# Patient Record
Sex: Female | Born: 1990 | Race: Black or African American | Hispanic: No | Marital: Single | Smoking: Never smoker
Health system: Southern US, Community
[De-identification: ages and names within clinical notes are randomized; demographics above are authoritative.]

## PROBLEM LIST (undated history)

## (undated) DIAGNOSIS — R519 Headache, unspecified: Secondary | ICD-10-CM

## (undated) HISTORY — PX: NO PAST SURGERIES: SHX2092

---

## 2012-04-28 ENCOUNTER — Encounter (HOSPITAL_COMMUNITY): Payer: Self-pay | Admitting: *Deleted

## 2012-04-28 ENCOUNTER — Emergency Department (INDEPENDENT_AMBULATORY_CARE_PROVIDER_SITE_OTHER)
Admission: EM | Admit: 2012-04-28 | Discharge: 2012-04-28 | Disposition: A | Discharge status: Home / Self Care | Payer: Medicaid Other | Source: Home / Self Care | Attending: Family Medicine | Admitting: Family Medicine

## 2012-04-28 DIAGNOSIS — K029 Dental caries, unspecified: Secondary | ICD-10-CM

## 2012-04-28 MED ORDER — CLINDAMYCIN HCL 150 MG PO CAPS: 150.0000 mg | ORAL_CAPSULE | Freq: Four times a day (QID) | ORAL | Status: DC

## 2012-04-28 MED ORDER — DICLOFENAC POTASSIUM 50 MG PO TABS: 50.0000 mg | ORAL_TABLET | Freq: Three times a day (TID) | ORAL | Status: DC

## 2012-04-28 NOTE — ED Provider Notes (Signed)
History     CSN: [id number]  Arrival date & time 04/28/12  1155   First MD Initiated Contact with Patient 04/28/12 1201      Chief Complaint  Patient presents with  . Dental Pain    (Consider location/radiation/quality/duration/timing/severity/associated sxs/prior treatment) Patient is a 21 y.o. female presenting with tooth pain. The history is provided by the patient.  Dental PainThe primary symptoms include mouth pain. Primary symptoms do not include fever. The symptoms began more than 1 week ago (filling fell out of tooth and pain has progressed since.). The symptoms are worsening. The symptoms are new. The symptoms occur constantly.  Additional symptoms include: dental sensitivity to temperature and jaw pain. Additional symptoms do not include: facial swelling, trouble swallowing and pain with swallowing.    History reviewed. No pertinent past medical history.  History reviewed. No pertinent past surgical history.  Family History  Problem Relation Age of Onset  . Family history unknown: Yes    History  Substance Use Topics  . Smoking status: Never Smoker   . Smokeless tobacco: Not on file  . Alcohol Use: No    OB History    Grav Para Term Preterm Abortions TAB SAB Ect Mult Living                  Review of Systems  Constitutional: Negative.  Negative for fever.  HENT: Positive for dental problem. Negative for facial swelling, trouble swallowing and neck pain.     Allergies  Sulfa antibiotics  Home Medications   Current Outpatient Rx  Name  Route  Sig  Dispense  Refill  . CLINDAMYCIN HCL 150 MG PO CAPS   Oral   Take 1 capsule (150 mg total) by mouth 4 (four) times daily.   28 capsule   0   . DICLOFENAC POTASSIUM 50 MG PO TABS   Oral   Take 1 tablet (50 mg total) by mouth 3 (three) times daily.   21 tablet   0     BP 122/74  Pulse 72  Temp 98.2 F (36.8 C) (Oral)  Resp 14  SpO2 98%  Physical Exam  Nursing note and vitals  reviewed. Constitutional: She appears well-developed and well-nourished.  HENT:  Head: Normocephalic.  Right Ear: External ear normal.  Left Ear: External ear normal.  Mouth/Throat: Uvula is midline and oropharynx is clear and moist.      ED Course  Procedures (including critical care time)  Labs Reviewed - No data to display No results found.   1. Pain due to dental caries       MDM          Linna Hoff, MD 04/28/12 1301

## 2012-04-28 NOTE — ED Notes (Signed)
Pt reports dental pain on bottom left side of mouth - states that filling recently fell out

## 2013-02-23 ENCOUNTER — Encounter (HOSPITAL_COMMUNITY): Payer: Self-pay | Admitting: Emergency Medicine

## 2013-02-23 ENCOUNTER — Emergency Department (HOSPITAL_COMMUNITY)
Admission: EM | Admit: 2013-02-23 | Discharge: 2013-02-23 | Disposition: A | Discharge status: Home / Self Care | Payer: Medicaid Other | Attending: Emergency Medicine | Admitting: Emergency Medicine

## 2013-02-23 DIAGNOSIS — A6009 Herpesviral infection of other urogenital tract: Secondary | ICD-10-CM

## 2013-02-23 DIAGNOSIS — Z79899 Other long term (current) drug therapy: Secondary | ICD-10-CM | POA: Insufficient documentation

## 2013-02-23 DIAGNOSIS — Z792 Long term (current) use of antibiotics: Secondary | ICD-10-CM | POA: Insufficient documentation

## 2013-02-23 DIAGNOSIS — A6000 Herpesviral infection of urogenital system, unspecified: Secondary | ICD-10-CM | POA: Insufficient documentation

## 2013-02-23 DIAGNOSIS — Z3202 Encounter for pregnancy test, result negative: Secondary | ICD-10-CM | POA: Insufficient documentation

## 2013-02-23 DIAGNOSIS — Z791 Long term (current) use of non-steroidal anti-inflammatories (NSAID): Secondary | ICD-10-CM | POA: Insufficient documentation

## 2013-02-23 DIAGNOSIS — R300 Dysuria: Secondary | ICD-10-CM | POA: Insufficient documentation

## 2013-02-23 LAB — URINALYSIS, ROUTINE W REFLEX MICROSCOPIC
Bilirubin Urine: NEGATIVE
Glucose, UA: NEGATIVE mg/dL
Hgb urine dipstick: NEGATIVE
Ketones, ur: NEGATIVE mg/dL
Leukocytes, UA: NEGATIVE
Nitrite: NEGATIVE
Protein, ur: NEGATIVE mg/dL
Specific Gravity, Urine: 1.03 (ref 1.005–1.030)
Urobilinogen, UA: 0.2 mg/dL (ref 0.0–1.0)
pH: 6 (ref 5.0–8.0)

## 2013-02-23 LAB — WET PREP, GENITAL
Clue Cells Wet Prep HPF POC: NONE SEEN
Trich, Wet Prep: NONE SEEN
Yeast Wet Prep HPF POC: NONE SEEN

## 2013-02-23 LAB — POCT PREGNANCY, URINE: Preg Test, Ur: NEGATIVE

## 2013-02-23 MED ORDER — ACYCLOVIR 400 MG PO TABS: 400.0000 mg | ORAL_TABLET | Freq: Three times a day (TID) | ORAL | Status: AC

## 2013-02-23 MED ORDER — HYDROCODONE-ACETAMINOPHEN 5-325 MG PO TABS: 1.0000 | ORAL_TABLET | Freq: Four times a day (QID) | ORAL | Status: DC | PRN

## 2013-02-23 MED ORDER — OXYCODONE-ACETAMINOPHEN 5-325 MG PO TABS
2.0000 | ORAL_TABLET | Freq: Once | ORAL | Status: AC
  Administered 2013-02-23: 2 via ORAL
  Filled 2013-02-23: qty 2

## 2013-02-23 NOTE — ED Provider Notes (Signed)
CSN: [id number]     Arrival date & time 02/23/13  0225 History   First MD Initiated Contact with Patient 02/23/13 [phone]     Chief Complaint  Patient presents with  . Vaginal Pain  . Abscess   (Consider location/radiation/quality/duration/timing/severity/associated sxs/prior Treatment) HPI Comments: C/o vaginal pain r/t vaginal boil (reports 2), onset 2d ago, also reports drainage for 1 day .Denies: UTI sx, back pain, hematuria, nvd, fever.   Patient is a 22 y.o. female presenting with female genitourinary complaint. The history is provided by the patient. No language interpreter was used.  Female GU Problem This is a new problem. The current episode started 2 days ago. The problem occurs constantly. The problem has been gradually improving. Pertinent negatives include no chest pain, no abdominal pain, no headaches and no shortness of breath. Exacerbated by: urination. Nothing relieves the symptoms. She has tried nothing for the symptoms. The treatment provided no relief.    History reviewed. No pertinent past medical history. History reviewed. No pertinent past surgical history. History reviewed. No pertinent family history. History  Substance Use Topics  . Smoking status: Never Smoker   . Smokeless tobacco: Not on file  . Alcohol Use: No   OB History   Grav Para Term Preterm Abortions TAB SAB Ect Mult Living                 Review of Systems  Constitutional: Negative for fever, chills, diaphoresis, activity change, appetite change and fatigue.  HENT: Negative for congestion, sore throat, facial swelling, rhinorrhea, neck pain and neck stiffness.   Eyes: Negative for photophobia and discharge.  Respiratory: Negative for cough, chest tightness and shortness of breath.   Cardiovascular: Negative for chest pain, palpitations and leg swelling.  Gastrointestinal: Negative for nausea, vomiting, abdominal pain and diarrhea.  Endocrine: Negative for polydipsia and polyuria.   Genitourinary: Positive for dysuria, genital sores and vaginal pain. Negative for frequency, difficulty urinating and pelvic pain.  Musculoskeletal: Negative for back pain and arthralgias.  Skin: Negative for color change and wound.  Allergic/Immunologic: Negative for immunocompromised state.  Neurological: Negative for facial asymmetry, weakness, numbness and headaches.  Hematological: Does not bruise/bleed easily.  Psychiatric/Behavioral: Negative for confusion and agitation.    Allergies  Sulfa antibiotics  Home Medications   Current Outpatient Rx  Name  Route  Sig  Dispense  Refill  . acyclovir (ZOVIRAX) 400 MG tablet   Oral   Take 1 tablet (400 mg total) by mouth 3 (three) times daily.   50 tablet   0   . clindamycin (CLEOCIN) 150 MG capsule   Oral   Take 1 capsule (150 mg total) by mouth 4 (four) times daily.   28 capsule   0   . diclofenac (CATAFLAM) 50 MG tablet   Oral   Take 1 tablet (50 mg total) by mouth 3 (three) times daily.   21 tablet   0   . HYDROcodone-acetaminophen (NORCO/VICODIN) 5-325 MG per tablet   Oral   Take 1 tablet by mouth every 6 (six) hours as needed for pain.   15 tablet   0    BP 101/66  Pulse 68  Temp(Src) 98.3 F (36.8 C) (Oral)  Resp 18  Wt 105 lb (47.628 kg)  SpO2 98% Physical Exam  Constitutional: She is oriented to person, place, and time. She appears well-developed and well-nourished. No distress.  HENT:  Head: Normocephalic and atraumatic.  Mouth/Throat: No oropharyngeal exudate.  Eyes: Pupils are equal, round, and  reactive to light.  Neck: Normal range of motion. Neck supple.  Cardiovascular: Normal rate, regular rhythm and normal heart sounds.  Exam reveals no gallop and no friction rub.   No murmur heard. Pulmonary/Chest: Effort normal and breath sounds normal. No respiratory distress. She has no wheezes. She has no rales.  Abdominal: Soft. Bowel sounds are normal. She exhibits no distension and no mass. There is no  tenderness. There is no rebound and no guarding.  Genitourinary: There is tenderness and lesion on the right labia. There is tenderness and lesion on the left labia.  Multiple lesions of vulva & labia. Small raised lesions with straw colored vesicle overlying erythematous base, some are shallow ulcerations.  Same seen distal vagina, one seen on cervix.   Musculoskeletal: Normal range of motion. She exhibits no edema and no tenderness.  Neurological: She is alert and oriented to person, place, and time.  Skin: Skin is warm and dry.  Psychiatric: She has a normal mood and affect.    ED Course  Procedures (including critical care time) Labs Review Labs Reviewed  WET PREP, GENITAL - Abnormal; Notable for the following:    WBC, Wet Prep HPF POC MANY (*)    All other components within normal limits  GC/CHLAMYDIA PROBE AMP  HERPES SIMPLEX VIRUS CULTURE  URINALYSIS, ROUTINE W REFLEX MICROSCOPIC  POCT PREGNANCY, URINE   Imaging Review No results found.  MDM   Pt is a 22 y.o. female with Pmhx as above who presents with 2 days of painful vagnal lesion that on exam are c/w genital herpes of vulva, labia, and several intravagnal lesion.  Pt able to urinate with some pain.  Urine not infection.  Wet prep unremarkable.  GC/Chlam pendin as well as HSV viral culture.  Will start on 10d course of acyclovir, as her to fu with gyn. Return precautions given for new or worsening symptoms   1. 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 Genital herpes in women     Shanna Cisco, MD 02/23/13 Ernestina [place]

## 2013-02-23 NOTE — ED Notes (Signed)
Docherty, MD at bedside.  

## 2013-02-23 NOTE — ED Notes (Signed)
C/o vaginal pain r/t vaginal boil (reports 2), onset 3d ago, also reports drainage, (denies: UTI sx, back pain, hematuria, nvd, fever or other sx), took acetaminophen at 2100 (not sure of dosage). H/o similar boils in the past, "not as bad".

## 2013-02-24 LAB — GC/CHLAMYDIA PROBE AMP
CT Probe RNA: NEGATIVE
GC Probe RNA: NEGATIVE

## 2013-02-26 LAB — HERPES SIMPLEX VIRUS CULTURE
Culture: DETECTED
Special Requests: NORMAL

## 2013-12-29 ENCOUNTER — Encounter (HOSPITAL_COMMUNITY): Payer: Self-pay | Admitting: Emergency Medicine

## 2013-12-29 ENCOUNTER — Emergency Department (HOSPITAL_COMMUNITY)
Admission: EM | Admit: 2013-12-29 | Discharge: 2013-12-29 | Disposition: A | Discharge status: Home / Self Care | Payer: BC Managed Care – PPO | Source: Home / Self Care | Attending: Family Medicine | Admitting: Family Medicine

## 2013-12-29 DIAGNOSIS — R0602 Shortness of breath: Secondary | ICD-10-CM

## 2013-12-29 NOTE — ED Provider Notes (Signed)
CSN: [id number]     Arrival date & time 12/29/13  1836 History   First MD Initiated Contact with Patient 12/29/13 1930     Chief Complaint  Patient presents with  . Shortness of Breath   (Consider location/radiation/quality/duration/timing/severity/associated sxs/prior Treatment) Patient is a 23 y.o. female presenting with shortness of breath. The history is provided by the patient.  Shortness of Breath Severity:  Mild Onset quality:  Sudden Duration:  1 hour Timing:  Sporadic Progression:  Improving Chronicity:  Chronic (sx for over 2526yr) Relieved by:  None tried Worsened by:  Nothing tried Ineffective treatments:  Inhaler Associated symptoms: chest pain   Associated symptoms: no abdominal pain, no cough, no fever and no PND     History reviewed. No pertinent past medical history. History reviewed. No pertinent past surgical history. History reviewed. No pertinent family history. History  Substance Use Topics  . Smoking status: Never Smoker   . Smokeless tobacco: Not on file  . Alcohol Use: No   OB History   Grav Para Term Preterm Abortions TAB SAB Ect Mult Living                 Review of Systems  Constitutional: Negative.  Negative for fever.  HENT: Negative.   Respiratory: Positive for shortness of breath. Negative for cough and chest tightness.   Cardiovascular: Positive for chest pain. Negative for palpitations, leg swelling and PND.  Gastrointestinal: Negative.  Negative for abdominal pain.    Allergies  Sulfa antibiotics  Home Medications   Prior to Admission medications   Medication Sig Start Date End Date Taking? Authorizing Provider  clindamycin (CLEOCIN) 150 MG capsule Take 1 capsule (150 mg total) by mouth 4 (four) times daily. 04/28/12   Linna HoffJames D Avantae Bither, MD  diclofenac (CATAFLAM) 50 MG tablet Take 1 tablet (50 mg total) by mouth 3 (three) times daily. 04/28/12   Linna HoffJames D Lainie Daubert, MD  HYDROcodone-acetaminophen (NORCO/VICODIN) 5-325 MG per tablet Take 1  tablet by mouth every 6 (six) hours as needed for pain. 02/23/13   Shanna CiscoMegan E Docherty, MD   BP 109/82  Pulse 79  Temp(Src) 98.4 F (36.9 C) (Oral)  Resp 18  SpO2 100% Physical Exam  Nursing note and vitals reviewed. Constitutional: She is oriented to person, place, and time. She appears well-developed and well-nourished. No distress.  HENT:  Mouth/Throat: Oropharynx is clear and moist.  Neck: Normal range of motion. Neck supple.  Cardiovascular: Normal heart sounds.   Pulmonary/Chest: Effort normal and breath sounds normal. No respiratory distress. She has no wheezes. She has no rales. She exhibits tenderness.  Lymphadenopathy:    She has no cervical adenopathy.  Neurological: She is alert and oriented to person, place, and time.  Skin: Skin is warm and dry.    ED Course  Procedures (including critical care time) Labs Review Labs Reviewed - No data to display  Imaging Review No results found.   MDM   1. Exertional shortness of breath        Linna HoffJames D Ainara Eldridge, MD 12/29/13 2004

## 2013-12-29 NOTE — ED Notes (Signed)
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 Hurts to breathe

## 2013-12-29 NOTE — Discharge Instructions (Signed)
Call to see lung specialist for further eval as needed.

## 2014-06-19 ENCOUNTER — Encounter (HOSPITAL_COMMUNITY): Payer: Self-pay | Admitting: Physical Medicine and Rehabilitation

## 2014-06-19 ENCOUNTER — Emergency Department (HOSPITAL_COMMUNITY)
Admission: EM | Admit: 2014-06-19 | Discharge: 2014-06-19 | Disposition: A | Discharge status: Home / Self Care | Payer: BLUE CROSS/BLUE SHIELD | Attending: Emergency Medicine | Admitting: Emergency Medicine

## 2014-06-19 DIAGNOSIS — R05 Cough: Secondary | ICD-10-CM | POA: Diagnosis present

## 2014-06-19 DIAGNOSIS — R519 Headache, unspecified: Secondary | ICD-10-CM

## 2014-06-19 DIAGNOSIS — R51 Headache: Secondary | ICD-10-CM | POA: Insufficient documentation

## 2014-06-19 DIAGNOSIS — Z791 Long term (current) use of non-steroidal anti-inflammatories (NSAID): Secondary | ICD-10-CM | POA: Insufficient documentation

## 2014-06-19 DIAGNOSIS — Z720 Tobacco use: Secondary | ICD-10-CM | POA: Insufficient documentation

## 2014-06-19 DIAGNOSIS — Z792 Long term (current) use of antibiotics: Secondary | ICD-10-CM | POA: Insufficient documentation

## 2014-06-19 MED ORDER — ALBUTEROL SULFATE HFA 108 (90 BASE) MCG/ACT IN AERS: 1.0000 | INHALATION_SPRAY | Freq: Four times a day (QID) | RESPIRATORY_TRACT | Status: DC | PRN

## 2014-06-19 MED ORDER — KETOROLAC TROMETHAMINE 30 MG/ML IJ SOLN
30.0000 mg | Freq: Once | INTRAMUSCULAR | Status: AC
  Administered 2014-06-19: 30 mg via INTRAVENOUS
  Filled 2014-06-19: qty 1

## 2014-06-19 MED ORDER — DEXAMETHASONE SODIUM PHOSPHATE 10 MG/ML IJ SOLN: 10.0000 mg | Freq: Once | INTRAMUSCULAR | Status: DC

## 2014-06-19 MED ORDER — DEXAMETHASONE SODIUM PHOSPHATE 10 MG/ML IJ SOLN
10.0000 mg | Freq: Once | INTRAMUSCULAR | Status: AC
  Administered 2014-06-19: 10 mg via INTRAVENOUS
  Filled 2014-06-19: qty 1

## 2014-06-19 NOTE — ED Notes (Addendum)
States has shortness of breath and tightness when breathing-- states has been going to the dr for a year and a half for same -- inhaler is expired.   Also c/o headache-- migraine-- hx of same

## 2014-06-19 NOTE — Discharge Instructions (Signed)
You are having a headache. No specific cause was found today for your headache. It may have been a migraine or other cause of headache. Stress, anxiety, fatigue, and depression are common triggers for headaches. Your headache today does not appear to be life-threatening or require hospitalization, but often the exact cause of headaches is not determined in the emergency department. Therefore, follow-up with your doctor is very important to find out what may have caused your headache, and whether or not you need any further diagnostic testing or treatment. Sometimes headaches can appear benign (not harmful), but then more serious symptoms can develop which should prompt an immediate re-evaluation by your doctor or the emergency department. SEEK MEDICAL ATTENTION IF: You develop possible problems with medications prescribed.  The medications don't resolve your headache, if it recurs , or if you have multiple episodes of vomiting or can't take fluids. You have a change from the usual headache. RETURN IMMEDIATELY IF you develop a sudden, severe headache or confusion, become poorly responsive or faint, develop a fever above 100.38F or problem breathing, have a change in speech, vision, swallowing, or understanding, or develop new weakness, numbness, tingling, incoordination, or have a seizure.  Migraine Headache A migraine headache is an intense, throbbing pain on one or both sides of your head. A migraine can last for 30 minutes to several hours. CAUSES  The exact cause of a migraine headache is not always known. However, a migraine may be caused when nerves in the brain become irritated and release chemicals that cause inflammation. This causes pain. Certain things may also trigger migraines, such as:  Alcohol.  Smoking.  Stress.  Menstruation.  Aged cheeses.  Foods or drinks that contain nitrates, glutamate, aspartame, or tyramine.  Lack of  sleep.  Chocolate.  Caffeine.  Hunger.  Physical exertion.  Fatigue.  Medicines used to treat chest pain (nitroglycerine), birth control pills, estrogen, and some blood pressure medicines. SIGNS AND SYMPTOMS  Pain on one or both sides of your head.  Pulsating or throbbing pain.  Severe pain that prevents daily activities.  Pain that is aggravated by any physical activity.  Nausea, vomiting, or both.  Dizziness.  Pain with exposure to bright lights, loud noises, or activity.  General sensitivity to bright lights, loud noises, or smells. Before you get a migraine, you may get warning signs that a migraine is coming (aura). An aura may include:  Seeing flashing lights.  Seeing bright spots, halos, or zigzag lines.  Having tunnel vision or blurred vision.  Having feelings of numbness or tingling.  Having trouble talking.  Having muscle weakness. DIAGNOSIS  A migraine headache is often diagnosed based on:  Symptoms.  Physical exam.  A CT scan or MRI of your head. These imaging tests cannot diagnose migraines, but they can help rule out other causes of headaches. TREATMENT Medicines may be given for pain and nausea. Medicines can also be given to help prevent recurrent migraines.  HOME CARE INSTRUCTIONS  Only take over-the-counter or prescription medicines for pain or discomfort as directed by your health care provider. The use of long-term narcotics is not recommended.  Lie down in a dark, quiet room when you have a migraine.  Keep a journal to find out what may trigger your migraine headaches. For example, write down:  What you eat and drink.  How much sleep you get.  Any change to your diet or medicines.  Limit alcohol consumption.  Quit smoking if you smoke.  Get 7-9 hours of  sleep, or as recommended by your health care provider.  Limit stress.  Keep lights dim if bright lights bother you and make your migraines worse. SEEK IMMEDIATE MEDICAL  CARE IF:   Your migraine becomes severe.  You have a fever.  You have a stiff neck.  You have vision loss.  You have muscular weakness or loss of muscle control.  You start losing your balance or have trouble walking.  You feel faint or pass out.  You have severe symptoms that are different from your first symptoms. MAKE SURE YOU:   Understand these instructions.  Will watch your condition.  Will get help right away if you are not doing well or get worse. Document Released: 06/05/2005 Document Revised: 10/20/2013 Document Reviewed: 02/10/2013 [facility] Patient Information 2015 [place], [place]. This information is not intended to replace advice given to you by your health care provider. Make sure you discuss any questions you have with your health care provider. Chest Wall Pain Chest wall pain is pain in or around the bones and muscles of your chest. It may take up to 6 weeks to get better. It may take longer if you must stay physically active in your work and activities.  CAUSES  Chest wall pain may happen on its own. However, it may be caused by:  A viral illness like the flu.  Injury.  Coughing.  Exercise.  Arthritis.  Fibromyalgia.  Shingles. HOME CARE INSTRUCTIONS   Avoid overtiring physical activity. Try not to strain or perform activities that cause pain. This includes any activities using your chest or your abdominal and side muscles, especially if heavy weights are used.  Put ice on the sore area.  Put ice in a plastic bag.  Place a towel between your skin and the bag.  Leave the ice on for 15-20 minutes per hour while awake for the first 2 days.  Only take over-the-counter or prescription medicines for pain, discomfort, or fever as directed by your caregiver. SEEK IMMEDIATE MEDICAL CARE IF:   Your pain increases, or you are very uncomfortable.  You have a fever.  Your chest pain becomes worse.  You have new, unexplained symptoms.  You have  nausea or vomiting.  You feel sweaty or lightheaded.  You have a cough with phlegm (sputum), or you cough up blood. MAKE SURE YOU:   Understand these instructions.  Will watch your condition.  Will get help right away if you are not doing well or get worse. Document Released: 06/05/2005 Document Revised: 08/28/2011 Document Reviewed: 01/30/2011 [facility] Patient Information 2015 [place], [place]. This information is not intended to replace advice given to you by your health care provider. Make sure you discuss any questions you have with your health care provider.

## 2014-06-19 NOTE — ED Notes (Signed)
Pt states productive cough with chest congestion x1 week. Respirations unlabored. Pt is alert and oriented x4. NAD.

## 2014-06-19 NOTE — ED Provider Notes (Signed)
CSN: [id number]     Arrival date & time 06/19/14  0703 History   First MD Initiated Contact with Patient 06/19/14 0715     Chief Complaint  Patient presents with  . Cough     (Consider location/radiation/quality/duration/timing/severity/associated sxs/prior Treatment) HPI  Sherry Rios is a(n) 24 y.o. female wh presents with chief complaint of headache and chest pain. The patient states she has a history of migraine headaches. She has a unilateral left-sided throbbing headache which has been ongoing since 2 AM this morning. She has associated nausea and photophobia. She denies photophobia, unilateral weakness, difficulty with speech or swallowing. She denies thunderclap onset, use of IV drugs or fevers. She took Tylenol at home without relief of her symptoms. Patient is also complaining of right-sided chest pain which been ongoing for months. She states she has been evaluated multiple times without a diagnosis. She has no active chest pain at this time. She states that it is right-sided, sharp, lasts seconds at a time. When it occurs, she feels she cannot breathe deeply due to the pain. She has had recent chest congestion and nasal congestion. [name] denies history of PE or DVT, has not history of cancer treatment, recent immobilization, recent trauma or surgery, denies hemoptysis, is not taking exogenous estrogen and has no unilateral leg swelling. She is a daily smoker.                                                                                                                                                                          History reviewed. No pertinent past surgical history. No family history on file. History  Substance Use Topics  . Smoking status: Current Every Day Smoker    Types: Cigarettes  . Smokeless tobacco: Not on file  . Alcohol Use: Yes     Comment: social   OB History    No data available     Review of Systems  Ten systems reviewed and are  negative for acute change, except as noted in the HPI.    Allergies  Sulfa antibiotics  Home Medications   Prior to Admission medications   Medication Sig Start Date End Date Taking? Authorizing Provider  clindamycin (CLEOCIN) 150 MG capsule Take 1 capsule (150 mg total) by mouth 4 (four) times daily. 04/28/12   Linna Hoff, MD  diclofenac (CATAFLAM) 50 MG tablet Take 1 tablet (50 mg total) by mouth 3 (three) times daily. 04/28/12   Linna Hoff, MD  HYDROcodone-acetaminophen (NORCO/VICODIN) 5-325 MG per tablet Take 1 tablet by mouth every 6 (six) hours as needed for pain. 02/23/13   Toy Cookey, MD   BP 127/88 mmHg  Pulse 78  Temp(Src) 98.7 F (37.1 C) (Oral)  Resp 18  Ht  (1.575 m)  Wt 105 lb (47.628 kg)  BMI 19.20 kg/m2  SpO2 99% Physical Exam  Constitutional: She is oriented to person, place, and time. She appears well-developed and well-nourished. No distress.  HENT:  Head: Normocephalic and atraumatic.  Right Ear: External ear normal.  Left Ear: External ear normal.  Mouth/Throat: No oropharyngeal exudate.  Eyes: Conjunctivae are normal. Pupils are equal, round, and reactive to light. No scleral icterus.  Neck: Normal range of motion. Neck supple. No JVD present. No thyromegaly present.  Cardiovascular: Normal rate, regular rhythm, normal heart sounds and intact distal pulses.  Exam reveals no gallop and no friction rub.   No murmur heard. Pulmonary/Chest: Effort normal and breath sounds normal. No respiratory distress. She exhibits no tenderness.  Abdominal: Soft. Bowel sounds are normal. She exhibits no distension and no mass. There is no tenderness. There is no guarding.  Musculoskeletal: Normal range of motion. She exhibits no edema or tenderness.  No meningismus  Neurological: She is alert and oriented to person, place, and time. She has normal reflexes. No cranial nerve deficit. Coordination normal.  Skin: Skin is warm and dry. No rash noted. She is not  diaphoretic.  Psychiatric: Her behavior is normal.  Nursing note and vitals reviewed.   ED Course  Procedures (including critical care time) Labs Review Labs Reviewed  URINALYSIS, ROUTINE W REFLEX MICROSCOPIC  POC URINE PREG, ED    Imaging Review No results found.   EKG Interpretation None      MDM   Final diagnoses:  Bad headache    10:19 AM BP 127/88 mmHg  Pulse 78  Temp(Src) 98.7 F (37.1 C) (Oral)  Resp 18  Ht  (1.575 m)  Wt 105 lb (47.628 kg)  BMI 19.20 kg/m2  SpO2 99% Patient with complaint of migraine headache. I have ordered migraine cocktail for her. She denies any red flag headache symptoms. And has a normal neurologic examination. I believe that her chest pain is either neuropathic or muscular in nature. I doubt any emergent cause of chest pain and she is chest pain-free at this time.  10:19 AM BP 127/88 mmHg  Pulse 78  Temp(Src) 98.7 F (37.1 C) (Oral)  Resp 18  Ht  (1.575 m)  Wt 105 lb (47.628 kg)  BMI 19.20 kg/m2  SpO2 99% Pt HA treated and improved while in ED.  Presentation is like pts typical HA and non concerning for [facility], [facility], Meningitis, or temporal arteritis. Pt is afebrile with no focal neuro deficits, nuchal rigidity, or change in vision. Pt is to follow up with PCP to discuss prophylactic medication. Pt verbalizes understanding and is agreeable with plan to dc. I do not feel that patient's chronic, fleeting chest pain warrants emergent evaluatio. She has also not had any pain in 48 hours.  Arthor Captain, PA-C 06/19/14 [address] PA-C 06/19/14 [address] PA-C 06/19/14 1046  Rolland Porter, MD 06/26/14 2352

## 2014-06-20 ENCOUNTER — Emergency Department (HOSPITAL_COMMUNITY)
Admission: EM | Admit: 2014-06-20 | Discharge: 2014-06-20 | Disposition: A | Discharge status: Home / Self Care | Payer: BLUE CROSS/BLUE SHIELD | Attending: Emergency Medicine | Admitting: Emergency Medicine

## 2014-06-20 ENCOUNTER — Encounter (HOSPITAL_COMMUNITY): Payer: Self-pay | Admitting: Emergency Medicine

## 2014-06-20 ENCOUNTER — Emergency Department (HOSPITAL_COMMUNITY): Payer: BLUE CROSS/BLUE SHIELD

## 2014-06-20 DIAGNOSIS — Z79899 Other long term (current) drug therapy: Secondary | ICD-10-CM | POA: Diagnosis not present

## 2014-06-20 DIAGNOSIS — Z792 Long term (current) use of antibiotics: Secondary | ICD-10-CM | POA: Insufficient documentation

## 2014-06-20 DIAGNOSIS — Z720 Tobacco use: Secondary | ICD-10-CM | POA: Insufficient documentation

## 2014-06-20 DIAGNOSIS — R079 Chest pain, unspecified: Secondary | ICD-10-CM

## 2014-06-20 DIAGNOSIS — R0789 Other chest pain: Secondary | ICD-10-CM | POA: Insufficient documentation

## 2014-06-20 DIAGNOSIS — Z791 Long term (current) use of non-steroidal anti-inflammatories (NSAID): Secondary | ICD-10-CM | POA: Insufficient documentation

## 2014-06-20 NOTE — ED Notes (Signed)
Pt made aware to return if symptoms worsen or if any life threatening symptoms occur.   

## 2014-06-20 NOTE — ED Provider Notes (Signed)
CSN: [id number]     Arrival date & time 06/20/14  1707 History   First MD Initiated Contact with Patient 06/20/14 1938     Chief Complaint  Patient presents with  . Chest Pain     (Consider location/radiation/quality/duration/timing/severity/associated sxs/prior Treatment) HPI Comments: 24 year old female with no significant alcohol history, no tobacco use, occasional marijuana use, no oral estrogen use presents with intermittent left sided chest pain sharp brief, recently moved to the right side. Worse with movement no injuries. No diaphoresis or exertional symptoms. Patient had similar episode recently during last ED visit. Patient currently feels well minimal symptoms.  Patient is a 24 y.o. female presenting with chest pain. The history is provided by the patient.  Chest Pain Associated symptoms: no abdominal pain, no back pain, no cough, no fever, no headache, no shortness of breath and not vomiting     History reviewed. No pertinent past medical history. History reviewed. No pertinent past surgical history. No family history on file. History  Substance Use Topics  . Smoking status: Current Every Day Smoker    Types: Cigarettes  . Smokeless tobacco: Not on file  . Alcohol Use: Yes     Comment: social   OB History    No data available     Review of Systems  Constitutional: Negative for fever and chills.  HENT: Negative for congestion.   Eyes: Negative for visual disturbance.  Respiratory: Negative for cough and shortness of breath.   Cardiovascular: Positive for chest pain. Negative for leg swelling.  Gastrointestinal: Negative for vomiting and abdominal pain.  Genitourinary: Negative for dysuria and flank pain.  Musculoskeletal: Negative for back pain, neck pain and neck stiffness.  Skin: Negative for rash.  Neurological: Negative for light-headedness and headaches.      Allergies  Sulfa antibiotics  Home Medications   Prior to Admission medications    Medication Sig Start Date End Date Taking? Authorizing Provider  albuterol (PROVENTIL HFA;VENTOLIN HFA) 108 (90 BASE) MCG/ACT inhaler Inhale 1-2 puffs into the lungs every 6 (six) hours as needed for wheezing or shortness of breath. 06/19/14   Arthor Captain, PA-C  clindamycin (CLEOCIN) 150 MG capsule Take 1 capsule (150 mg total) by mouth 4 (four) times daily. 04/28/12   Linna Hoff, MD  diclofenac (CATAFLAM) 50 MG tablet Take 1 tablet (50 mg total) by mouth 3 (three) times daily. 04/28/12   Linna Hoff, MD  HYDROcodone-acetaminophen (NORCO/VICODIN) 5-325 MG per tablet Take 1 tablet by mouth every 6 (six) hours as needed for pain. 02/23/13   Toy Cookey, MD   BP 101/66 mmHg  Pulse 68  Temp(Src) 97.9 F (36.6 C) (Oral)  Resp 14  Ht  (1.575 m)  Wt 106 lb (48.081 kg)  BMI 19.38 kg/m2  SpO2 100% Physical Exam  Constitutional: She is oriented to person, place, and time. She appears well-developed and well-nourished.  HENT:  Head: Normocephalic and atraumatic.  Eyes: Conjunctivae are normal. Right eye exhibits no discharge. Left eye exhibits no discharge.  Neck: Normal range of motion. Neck supple. No tracheal deviation present.  Cardiovascular: Normal rate, regular rhythm and intact distal pulses.   No murmur heard. Pulmonary/Chest: Effort normal and breath sounds normal.  Abdominal: Soft. She exhibits no distension. There is no tenderness. There is no guarding.  Musculoskeletal: She exhibits no edema.  Neurological: She is alert and oriented to person, place, and time.  Skin: Skin is warm. No rash noted.  Psychiatric: She has a normal mood and  affect.  Nursing note and vitals reviewed.   ED Course  Procedures (including critical care time) Labs Review Labs Reviewed - No data to display  Imaging Review Dg Chest Portable 1 View  06/20/2014   CLINICAL DATA:  Initial encounter for left-sided chest pain and shortness of breath started yesterday  EXAM: PORTABLE CHEST - 1 VIEW   COMPARISON:  None.  FINDINGS: 2037 hrs. The lungs are clear without focal infiltrate, edema, pneumothorax or pleural effusion. The cardiopericardial silhouette is within normal limits for size. Imaged bony structures of the thorax are intact. Telemetry leads overlie the chest.  IMPRESSION: Normal cardiopulmonary exam.   Electronically Signed   By: Kennith Center M.D.   On: 06/20/2014 20:52     EKG Interpretation   Date/Time:  Saturday June 20 2014 17:11:57 EST Ventricular Rate:  68 PR Interval:  120 QRS Duration: 80 QT Interval:  372 QTC Calculation: 395 R Axis:   79 Text Interpretation:  Normal sinus rhythm with sinus arrhythmia Normal ECG  Early repolarization Confirmed by Devra Stare  MD, Dennis Hegeman ([id number]) on 06/20/2014  7:55:15 PM      MDM   Final diagnoses:  Chest pain   Well-appearing female with atypical chest pain. Patient very low risk, EKG reviewed unremarkable. Discussed outpatient follow-up, chest x-ray pending. Patient very low risk cardiac, very low risk blood clot, PE RC negative. CXR reviewed no acute findings.  Results and differential diagnosis were discussed with the patient/parent/guardian. Close follow up outpatient was discussed, comfortable with the plan.   Medications - No data to display  Filed Vitals:   06/20/14 2000 06/20/14 2015 06/20/14 2030 06/20/14 2045  BP: 110/66 107/66 109/68 101/66  Pulse: 63 63 65 68  Temp:      TempSrc:      Resp: Height:      Weight:      SpO2: 100% 100% 100% 100%    Final diagnoses:  Chest pain      Enid Skeens, MD 06/20/14 2057

## 2014-06-20 NOTE — Discharge Instructions (Signed)
If you were given medicines take as directed.  If you are on coumadin or contraceptives realize their levels and effectiveness is altered by many different medicines.  If you have any reaction (rash, tongues swelling, other) to the medicines stop taking and see a physician.   Please follow up as directed and return to the ER or see a physician for new or worsening symptoms.  Thank you. Filed Vitals:   06/20/14 2000 06/20/14 2015 06/20/14 2030 06/20/14 2045  BP: 110/66 107/66 109/68 101/66  Pulse: 63 63 65 68  Temp:      TempSrc:      Resp: Height:      Weight:      SpO2: 100% 100% 100% 100%    Chest Pain (Nonspecific) It is often hard to give a specific diagnosis for the cause of chest pain. There is always a chance that your pain could be related to something serious, such as a heart attack or a blood clot in the lungs. You need to follow up with your health care provider for further evaluation. CAUSES   Heartburn.  Pneumonia or bronchitis.  Anxiety or stress.  Inflammation around your heart (pericarditis) or lung (pleuritis or pleurisy).  A blood clot in the lung.  A collapsed lung (pneumothorax). It can develop suddenly on its own (spontaneous pneumothorax) or from trauma to the chest.  Shingles infection (herpes zoster virus). The chest wall is composed of bones, muscles, and cartilage. Any of these can be the source of the pain.  The bones can be bruised by injury.  The muscles or cartilage can be strained by coughing or overwork.  The cartilage can be affected by inflammation and become sore (costochondritis). DIAGNOSIS  Lab tests or other studies may be needed to find the cause of your pain. Your health care provider may have you take a test called an ambulatory electrocardiogram (ECG). An ECG records your heartbeat patterns over a 24-hour period. You may also have other tests, such as:  Transthoracic echocardiogram (TTE). During echocardiography, sound  waves are used to evaluate how blood flows through your heart.  Transesophageal echocardiogram (TEE).  Cardiac monitoring. This allows your health care provider to monitor your heart rate and rhythm in real time.  Holter monitor. This is a portable device that records your heartbeat and can help diagnose heart arrhythmias. It allows your health care provider to track your heart activity for several days, if needed.  Stress tests by exercise or by giving medicine that makes the heart beat faster. TREATMENT   Treatment depends on what may be causing your chest pain. Treatment may include:  Acid blockers for heartburn.  Anti-inflammatory medicine.  Pain medicine for inflammatory conditions.  Antibiotics if an infection is present.  You may be advised to change lifestyle habits. This includes stopping smoking and avoiding alcohol, caffeine, and chocolate.  You may be advised to keep your head raised (elevated) when sleeping. This reduces the chance of acid going backward from your stomach into your esophagus. Most of the time, nonspecific chest pain will improve within 2-3 days with rest and mild pain medicine.  HOME CARE INSTRUCTIONS   If antibiotics were prescribed, take them as directed. Finish them even if you start to feel better.  For the next few days, avoid physical activities that bring on chest pain. Continue physical activities as directed.  Do not use any tobacco products, including cigarettes, chewing tobacco, or electronic cigarettes.  Avoid drinking alcohol.  Only take medicine as directed by your health care provider.  Follow your health care provider's suggestions for further testing if your chest pain does not go away.  Keep any follow-up appointments you made. If you do not go to an appointment, you could develop lasting (chronic) problems with pain. If there is any problem keeping an appointment, call to reschedule. SEEK MEDICAL CARE IF:   Your chest pain  does not go away, even after treatment.  You have a rash with blisters on your chest.  You have a fever. SEEK IMMEDIATE MEDICAL CARE IF:   You have increased chest pain or pain that spreads to your arm, neck, jaw, back, or abdomen.  You have shortness of breath.  You have an increasing cough, or you cough up blood.  You have severe back or abdominal pain.  You feel nauseous or vomit.  You have severe weakness.  You faint.  You have chills. This is an emergency. Do not wait to see if the pain will go away. Get medical help at once. Call your local emergency services (911 in [place] Do not drive yourself to the hospital. MAKE SURE YOU:   Understand these instructions.  Will watch your condition.  Will get help right away if you are not doing well or get worse. Document Released: 03/15/2005 Document Revised: 06/10/2013 Document Reviewed: 01/09/2008 [facility] Patient Information 2015 [place], [place]. This information is not intended to replace advice given to you by your health care provider. Make sure you discuss any questions you have with your health care provider.

## 2014-06-20 NOTE — ED Notes (Signed)
Pt reports being seen at [facility] yesterday for chest pain.  Pt came in today with left sided chest pain shooting across her chest and radiating to back with SOB.  Pt denies n/v/d.  Pt alert and oriented.

## 2014-06-20 NOTE — ED Notes (Signed)
Pt reports while at work experienced left sided chest pain that radiates to back.

## 2014-07-24 ENCOUNTER — Emergency Department (HOSPITAL_COMMUNITY)
Admission: EM | Admit: 2014-07-24 | Discharge: 2014-07-24 | Disposition: A | Discharge status: Home / Self Care | Payer: BLUE CROSS/BLUE SHIELD | Attending: Emergency Medicine | Admitting: Emergency Medicine

## 2014-07-24 ENCOUNTER — Encounter (HOSPITAL_COMMUNITY): Payer: Self-pay | Admitting: Emergency Medicine

## 2014-07-24 DIAGNOSIS — Z87891 Personal history of nicotine dependence: Secondary | ICD-10-CM | POA: Insufficient documentation

## 2014-07-24 DIAGNOSIS — K088 Other specified disorders of teeth and supporting structures: Secondary | ICD-10-CM | POA: Diagnosis not present

## 2014-07-24 DIAGNOSIS — Z792 Long term (current) use of antibiotics: Secondary | ICD-10-CM | POA: Diagnosis not present

## 2014-07-24 DIAGNOSIS — Z791 Long term (current) use of non-steroidal anti-inflammatories (NSAID): Secondary | ICD-10-CM | POA: Insufficient documentation

## 2014-07-24 DIAGNOSIS — K0889 Other specified disorders of teeth and supporting structures: Secondary | ICD-10-CM

## 2014-07-24 DIAGNOSIS — K029 Dental caries, unspecified: Secondary | ICD-10-CM | POA: Insufficient documentation

## 2014-07-24 MED ORDER — BUPIVACAINE-EPINEPHRINE (PF) 0.25% -1:200000 IJ SOLN
1.8000 mL | Freq: Once | INTRAMUSCULAR | Status: AC
  Administered 2014-07-24: 1.8 mL

## 2014-07-24 MED ORDER — NAPROXEN 500 MG PO TABS: 500.0000 mg | ORAL_TABLET | Freq: Two times a day (BID) | ORAL | Status: DC

## 2014-07-24 MED ORDER — AMOXICILLIN 500 MG PO CAPS: 500.0000 mg | ORAL_CAPSULE | Freq: Three times a day (TID) | ORAL | Status: DC

## 2014-07-24 NOTE — ED Notes (Signed)
Pt arrives with c/o dental pain since 2100 last night, bottom left jaw. States she took ibuprofen and vicodin, not effective. States she has no regular dentist.

## 2014-07-24 NOTE — Discharge Instructions (Signed)
Dental Pain °A tooth ache may be caused by cavities (tooth decay). Cavities expose the nerve of the tooth to air and hot or cold temperatures. It may come from an infection or abscess (also called a boil or furuncle) around your tooth. It is also often caused by dental caries (tooth decay). This causes the pain you are having. °DIAGNOSIS  °Your caregiver can diagnose this problem by exam. °TREATMENT  °· If caused by an infection, it may be treated with medications which kill germs (antibiotics) and pain medications as prescribed by your caregiver. Take medications as directed. °· Only take over-the-counter or prescription medicines for pain, discomfort, or fever as directed by your caregiver. °· Whether the tooth ache today is caused by infection or dental disease, you should see your dentist as soon as possible for further care. °SEEK MEDICAL CARE IF: °The exam and treatment you received today has been provided on an emergency basis only. This is not a substitute for complete medical or dental care. If your problem worsens or new problems (symptoms) appear, and you are unable to meet with your dentist, call or return to this location. °SEEK IMMEDIATE MEDICAL CARE IF:  °· You have a fever. °· You develop redness and swelling of your face, jaw, or neck. °· You are unable to open your mouth. °· You have severe pain uncontrolled by pain medicine. °MAKE SURE YOU:  °· Understand these instructions. °· Will watch your condition. °· Will get help right away if you are not doing well or get worse. °Document Released: 06/05/2005 Document Revised: 08/28/2011 Document Reviewed: 01/22/2008 °ExitCare® Patient Information ©2015 [employer]. This information is not intended to replace advice given to you by your health care provider. Make sure you discuss any questions you have with your health care provider. ° °Emergency Department Resource Guide °1) Find a Doctor and Pay Out of Pocket °Although you won't have to find out who  is covered by your insurance plan, it is a good idea to ask around and get recommendations. You will then need to call the office and see if the doctor you have chosen will accept you as a new patient and what types of options they offer for patients who are self-pay. Some doctors offer discounts or will set up payment plans for their patients who do not have insurance, but you will need to ask so you aren't surprised when you get to your appointment. ° °2) Contact Your Local Health Department °Not all health departments have doctors that can see patients for sick visits, but many do, so it is worth a call to see if yours does. If you don't know where your local health department is, you can check in your phone book. The CDC also has a tool to help you locate your state's health department, and many state websites also have listings of all of their local health departments. ° °3) Find a Walk-in Clinic °If your illness is not likely to be very severe or complicated, you may want to try a walk in clinic. These are popping up all over the country in pharmacies, drugstores, and shopping centers. They're usually staffed by nurse practitioners or physician assistants that have been trained to treat common illnesses and complaints. They're usually fairly quick and inexpensive. However, if you have serious medical issues or chronic medical problems, these are probably not your best option. ° °No Primary Care Doctor: °- Call [facility] at  832-8000 - they can help you locate a primary   care doctor that  accepts your insurance, provides certain services, etc. °- Physician Referral Service- 1-800-533-3463 ° °Chronic Pain Problems: °Organization         Address  Phone   Notes  °[facility]  (336) 297-2271 Patients need to be referred by their primary care doctor.  ° °Medication Assistance: °Organization         Address  Phone   Notes  °[place] [facility] [address]  [address] °[place], [place] [address] (336) 641-8030 --Must be a resident of [place] °-- Must have NO insurance coverage whatsoever (no Medicaid/ Medicare, etc.) °-- The pt. MUST have a primary care doctor that directs their care regularly and follows them in the community °  °[facility]  (866) 331-1348   °[employer]  (888) 892-1162   ° °Agencies that provide inexpensive medical care: °Organization         Address  Phone   Notes  °[facility]  (336) 832-8035   °[facility]    (336) 832-7272   °[facility] [address] °[place], [place] [address] (336) 832-4777   °[facility] [address], °[place] (336) 271-4999   °[facility]    (336) 373-0678   °[facility]    (336) 272-1050   °[facility] ° [address], [place] Phone:  (336) 832-4444, Fax:  (336) 832-4440 Hours of Operation:  9 am - 6 pm, M-F.  Also accepts Medicaid/Medicare and self-pay.  °[facility] ° [address], [place] Phone: (336) 832-3150, Fax: (336) 832-3151. Hours of Operation:  8:30 am - 5:30 pm, M-F.  Also accepts Medicaid and self-pay.  °[facility] [place] [address], [place] Phone: (336) 878-6027   °[facility] [address], [place], [place] (336)723-1848, Ext. 123 Mondays & Thursdays: 7-9 AM.  First 15 patients are seen on a first come, first serve basis. °  ° °Medicaid-accepting [place] Providers: ° °Organization         Address  Phone   Notes  °[facility] [address], [place] (336) 641-2100 Also accepts self-pay patients.  °[facility] [address], [place] ° (336) 856-9996   °[facility] [address], [place] (336) 288-8857   °[facility] [address] [place] [address], [place] (336) 299-7000   °Veita Bland [address], [place]  ° (336) 373-1557 Only accepts [place] Access Medicaid patients after they have their name applied to their card.  ° °Self-Pay (no insurance) in [place]: ° °Organization         Address  Phone   Notes  °Sickle Cell Patients, [facility] [address], [place] (336) 832-1970   °[facility] [address], [place] (336) 832-4400   ° [facility] ° [address], [place] (336) 992-4800   °[facility]/Dr. Osei-Bonsu ° [address] [place] [address], [place] or [address], [place] (336) 841-8500 Phone number for both [place] and [place] locations is the same.  °[facility] [address], [place] (336) 299-0000   °[facility]  [address] [place] [address],  or [address] (336) 852-7530 °(336) 878-2260   °  [facility] [address], [place] (336) 350-1642, phone; (336) 294-5005, fax Sees patients 1st and 3rd Saturday of every month.  Must not qualify for public or private insurance (i.e. Medicaid, Medicare, [place] Health Choice, Veterans' Benefits) • Household income should be no more than 200% of the poverty level •The clinic cannot treat you if you are pregnant or think you are pregnant • Sexually transmitted diseases are not treated at the clinic.  ° ° °Dental Care: °Organization         Address  Phone  Notes  °[facility] [facility] [address], [place] (336) 641-6152 Accepts children up to age 21 who are enrolled in Medicaid or [place] Health Choice; pregnant women with a Medicaid card; and children who have applied for Medicaid or [place] Health Choice, but were declined, whose parents can pay a reduced fee at time of service.  °[facility] [place]  [address], [place] (336) 641-7733 Accepts children up to age 21 who are  enrolled in Medicaid or [place] Health Choice; pregnant women with a Medicaid card; and children who have applied for Medicaid or [place] Health Choice, but were declined, whose parents can pay a reduced fee at time of service.  °[facility] ° [address], [place] (336) 641-4533 Patients are seen by appointment only. Walk-ins are not accepted. [facility] will see patients 18 years of age and older. °Monday - Tuesday (8am-5pm) °Most Wednesdays (8:30-5pm) °$30 per visit, cash only  °[facility] ° [address], [place] (336) 641-4533 Patients are seen by appointment only. Walk-ins are not accepted. [facility] will see patients 18 years of age and older. °One Wednesday Evening (Monthly: Volunteer Based).  $30 per visit, cash only  °[facility]  (919) 537-3737 for adults; Children under age 4, call Graduate Pediatric Dentistry at (919) [phone]. Children aged 4-14, please call (919) [phone] to request a pediatric application. ° Dental services are provided in all areas of dental care including fillings, crowns and bridges, complete and partial dentures, implants, gum treatment, root canals, and extractions. Preventive care is also provided. Treatment is provided to both adults and children. °Patients are selected via a lottery and there is often a waiting list. °  °[facility] [address], ° ° (336) 763-8833 [email or web address] °  °[facility] [address], [place], [place] (336)723-1848, Ext. 123 Second and Fourth Thursday of each month, opens at 6:30 AM; Clinic ends at 9 AM.  Patients are seen on a first-come first-served basis, and a limited number are seen during each clinic.  ° °[facility] ° [address], [place], [place] (336) 723-7904   Eligibility Requirements °You must have lived in [place], [place], or [place] counties for at least the last three months. °  You  cannot be eligible for state or federal sponsored healthcare insurance, including Veterans Administration, Medicaid, or Medicare. °  You generally cannot be eligible for healthcare insurance through your employer.  °  How to apply: °Eligibility screenings are held every Tuesday and Wednesday afternoon from 1:00 pm until 4:00 pm. You do not need an appointment for the interview!  °[facility] [address], [place], [place] 336-631-2330   °[facility] [facility]  336-342-8273   °[facility]  336-703-3100   °[facility]  336-570-6415   ° °Behavioral Health Resources in the Community: °Intensive Outpatient Programs °Organization         Address  Phone  Notes  °[facility] [address], [place], [place] 336-878-6098   °[facility] [address], [place], [place] 336-832-9800   °[facility] [address], [place], [place] ° 336-882-2125   °[facility] [address],  °[place], [place] 1-800-853-5163 or 336-641-4981   °Substance Abuse Resources °Organization         Address  Phone  Notes  °[facility]  336-882-2125   °[facility]  336-784-9470   °[facility]  336-285-9073   °[facility]  336-845-3988   °Residential & Outpatient Substance Abuse Program  1-800-659-3381   °Psychological Services °Organization         Address  Phone  Notes  ° Health  336- 832-9600   °[facility]  336- 378-7881   °[facility] [address], [place] 1-800-853-5163 or 336-641-4981   ° °Mobile Crisis Teams °Organization         Address  Phone  Notes  °[facility], Mobile Crisis Care Unit  1-877-626-1772   °Assertive °[facility] ° [address] [place], [place] 336-834-9664   °Sharon DeEsch [address] °[place] 336-554-5454   ° °Self-Help/Support  Groups °Organization         Address  Phone             Notes  °[facility] [place] - variety of support groups  336- 373-1402 Call for more information  °Narcotics Anonymous (NA), [facility] [address], °[place]  2 meetings at this location  ° °Residential Treatment Programs °Organization         Address  Phone  Notes  °[facility] [address],    °[place]  1-866-801-8205   °[facility] ° [address], [place], [place] 704-293-8524   °[facility] [address], [place] 336-845-3988 Admissions: 8am-3pm M-F  °[facility] [address]    °[place], [place] 336-841-1104   °[facility] [address], [place], [place] 336-379-7146   °[facility] [address]  °[place], [place] 336-285-9073   °[facility] - Intensive Outpatient [address], [place], [place] 336-852-3033   °[facility] [address]  °[place], [place] 1-877-615-2722 or 336-784-9470   °[facility] [address] [place], [place] 336-227-7417 Accepts Medicaid  °[facility] [address]  ° [place] 1-800-659-3381 Substance Abuse/Addiction Treatment  ° °[place] Behavioral Health Resources °Organization         Address  Phone  Notes  °[facility]  (888) 581-9988   °Julie Brannon, PhD [address] [place], [place]   (336) 349-5553 or (336) 951-0000   °[facility]   [address] °[place], [place] (336) 349-4454   °[facility] [address], [place],  (336) 342-8316 Insurance/Medicaid/sponsorship through [facility]  °[facility] [address]                                    [place],  (336) 342-8316 Therapy/tele-psych/case  °[facility]   [address]  ° [place], [place] (336) 349-2233    °Dr. Arfeen  (336) 349-4544   °[facility] [place]  [facility]  [employer] [facility] [place] [facility] 1) [address], [place] °2) [address], [place] °3)  [address], [place] (336) 349-3220 °(336) 342-7768 ° °(336) 342-8140   °[place] [facility] (336) 342-1394 or (336) 342-3537 (After Hours)    ° ° ° °

## 2014-07-24 NOTE — ED Provider Notes (Signed)
CSN: [id number]     Arrival date & time 07/24/14  0436 History   First MD Initiated Contact with Patient 07/24/14 [phone]     Chief Complaint  Patient presents with  . Dental Pain     (Consider location/radiation/quality/duration/timing/severity/associated sxs/prior Treatment) Patient is a 24 y.o. female presenting with tooth pain. The history is provided by the patient. No language interpreter was used.  Dental Pain Location:  Lower Lower teeth location:  20/LL 2nd bicuspid Quality:  Throbbing and aching Severity:  Moderate Onset quality:  Gradual Duration:  2 days Timing:  Intermittent Progression:  Waxing and waning Chronicity:  New Context: cap fell off and dental caries   Context: not trauma   Previous work-up:  Dental exam Relieved by:  NSAIDs Worsened by:  Touching and pressure Ineffective treatments: Norco. Associated symptoms: facial pain   Associated symptoms: no difficulty swallowing, no drooling, no facial swelling, no fever, no gum swelling, no oral bleeding and no oral lesions   Risk factors: lack of dental care     History reviewed. No pertinent past medical history. History reviewed. No pertinent past surgical history. No family history on file. History  Substance Use Topics  . Smoking status: Former Smoker    Types: Cigarettes  . Smokeless tobacco: Not on file  . Alcohol Use: Yes     Comment: social   OB History    No data available      Review of Systems  Constitutional: Negative for fever.  HENT: Positive for dental problem. Negative for drooling, facial swelling and mouth sores.   All other systems reviewed and are negative.   Allergies  Sulfa antibiotics  Home Medications   Prior to Admission medications   Medication Sig Start Date End Date Taking? Authorizing Provider  albuterol (PROVENTIL HFA;VENTOLIN HFA) 108 (90 BASE) MCG/ACT inhaler Inhale 1-2 puffs into the lungs every 6 (six) hours as needed for wheezing or shortness of breath.  06/19/14   Arthor Captain, PA-C  clindamycin (CLEOCIN) 150 MG capsule Take 1 capsule (150 mg total) by mouth 4 (four) times daily. 04/28/12   Linna Hoff, MD  diclofenac (CATAFLAM) 50 MG tablet Take 1 tablet (50 mg total) by mouth 3 (three) times daily. 04/28/12   Linna Hoff, MD  HYDROcodone-acetaminophen (NORCO/VICODIN) 5-325 MG per tablet Take 1 tablet by mouth every 6 (six) hours as needed for pain. 02/23/13   Toy Cookey, MD   BP 108/69 mmHg  Pulse 75  Temp(Src) 97.7 F (36.5 C) (Oral)  Resp 16  Ht  (1.575 m)  Wt 106 lb (48.081 kg)  BMI 19.38 kg/m2  SpO2 99%   Physical Exam  Constitutional: She is oriented to person, place, and time. She appears well-developed and well-nourished. No distress.  Nontoxic/nonseptic appearing  HENT:  Head: Normocephalic and atraumatic.  Right Ear: External ear normal.  Left Ear: External ear normal.  Mouth/Throat: Uvula is midline and oropharynx is clear and moist. No trismus in the jaw. Abnormal dentition. Dental caries present. No dental abscesses or uvula swelling.    Tenderness to palpation to left lower second premolar. No gingival swelling or fluctuance. No trismus. Patient tolerating secretions without difficulty.  Eyes: Conjunctivae and EOM are normal. Pupils are equal, round, and reactive to light. No scleral icterus.  Neck: Normal range of motion.  Pulmonary/Chest: Effort normal. No respiratory distress.  Respirations even and unlabored  Musculoskeletal: Normal range of motion.  Neurological: She is alert and oriented to person, place, and time.  She exhibits normal muscle tone. Coordination normal.  Skin: Skin is warm and dry. No rash noted. She is not diaphoretic. No erythema. No pallor.  Psychiatric: She has a normal mood and affect. Her behavior is normal.  Nursing note and vitals reviewed.   ED Course  Dental Date/Time: 07/24/2014 5:28 AM Performed by: Antony MaduraHUMES, Ardenia Stiner Authorized by: Antony MaduraHUMES, Hildegard Hlavac Consent: The procedure was  performed in an emergent situation. Verbal consent obtained. Written consent not obtained. Risks and benefits: risks, benefits and alternatives were discussed Consent given by: patient Patient understanding: patient states understanding of the procedure being performed Patient consent: the patient's understanding of the procedure matches consent given Procedure consent: procedure consent matches procedure scheduled Relevant documents: relevant documents present and verified Test results: test results available and properly labeled Site marked: the operative site was marked Imaging studies: imaging studies available Required items: required blood products, implants, devices, and special equipment available Patient identity confirmed: verbally with patient and arm band Time out: Immediately prior to procedure a "time out" was called to verify the correct patient, procedure, equipment, support staff and site/side marked as required. Preparation: Patient was prepped and draped in the usual sterile fashion. Local anesthesia used: yes Anesthesia: local infiltration Local anesthetic: bupivacaine 0.5% with epinephrine Anesthetic total: 1.8 ml Patient sedated: no Patient tolerance: Patient tolerated the procedure well with no immediate complications   (including critical care time) Labs Review Labs Reviewed - No data to display  Imaging Review No results found.   EKG Interpretation None      MDM   Final diagnoses:  Dentalgia    Patient with toothache. No gross abscess. Exam unconcerning for Ludwig's angina or spread of infection. Will treat with penicillin and pain medicine. Urged patient to follow-up with dentist. Referral and resource guide provided. Patient agreeable to plan with no unaddressed concerns.   Filed Vitals:   07/24/14 0446  BP: 108/69  Pulse: 75  Temp: 97.7 F (36.5 C)  TempSrc: Oral  Resp: 16  Height: 5\' 2"  (1.575 m)  Weight: 106 lb (48.081 kg)  SpO2: 99%          Antony MaduraKelly Jenah Vanasten, PA-C 07/24/14 0555  Loren Raceravid Yelverton, MD 07/24/14 [phone]

## 2014-08-24 ENCOUNTER — Encounter (HOSPITAL_COMMUNITY): Payer: Self-pay | Admitting: *Deleted

## 2014-08-24 ENCOUNTER — Emergency Department (HOSPITAL_COMMUNITY)
Admission: EM | Admit: 2014-08-24 | Discharge: 2014-08-24 | Disposition: A | Discharge status: Home / Self Care | Payer: BLUE CROSS/BLUE SHIELD | Attending: Emergency Medicine | Admitting: Emergency Medicine

## 2014-08-24 DIAGNOSIS — Z791 Long term (current) use of non-steroidal anti-inflammatories (NSAID): Secondary | ICD-10-CM | POA: Diagnosis not present

## 2014-08-24 DIAGNOSIS — Z792 Long term (current) use of antibiotics: Secondary | ICD-10-CM | POA: Insufficient documentation

## 2014-08-24 DIAGNOSIS — J029 Acute pharyngitis, unspecified: Secondary | ICD-10-CM

## 2014-08-24 LAB — RAPID STREP SCREEN (MED CTR MEBANE ONLY): Streptococcus, Group A Screen (Direct): NEGATIVE

## 2014-08-24 MED ORDER — IBUPROFEN 400 MG PO TABS
800.0000 mg | ORAL_TABLET | Freq: Once | ORAL | Status: AC
  Administered 2014-08-24: 800 mg via ORAL
  Filled 2014-08-24: qty 2

## 2014-08-24 NOTE — ED Notes (Signed)
Patient states she has had a cough and sore throat since Sat.  States she is sore from coughing.  Seen at Urgent Care yesterday and swabbed for the flu but was negative.  They told her she had strep throat because her tonsils were swollen, did not swab, started on PCN

## 2014-08-24 NOTE — ED Provider Notes (Signed)
CSN: [id number]     Arrival date & time 08/24/14  2042 History  This chart was scribed for non-physician practitioner, Langston MaskerKaren Sofia, PA-C working with Jerelyn ScottMartha Linker, MD by Greggory StallionKayla Andersen, ED scribe. This patient was seen in room TR08C/TR08C and the patient's care was started at 9:25 PM.   Chief Complaint  Patient presents with  . Sore Throat  . Cough   The history is provided by the patient. No language interpreter was used.    HPI Comments: Sherry Rios is a 24 y.o. female who presents to the Emergency Department complaining of cough, nasal congestion and sore throat that started 2 days ago. She also reports generalized body aches. Pt was evaluated at Urgent Care yesterday where flu test was done and was negative. Rapid strep was not preformed but pt was discharged with penicillin for treatment of it. She has only taken one day of the medication but states it hasn't provided any relief. Pt has also taken tylenol with no relief.   History reviewed. No pertinent past medical history. History reviewed. No pertinent past surgical history. No family history on file. History  Substance Use Topics  . Smoking status: Never Smoker   . Smokeless tobacco: Never Used  . Alcohol Use: Yes     Comment: social   OB History    No data available     Review of Systems  HENT: Positive for congestion and sore throat.   Respiratory: Positive for cough.   Musculoskeletal: Positive for myalgias.  All other systems reviewed and are negative.  Allergies  Sulfa antibiotics  Home Medications   Prior to Admission medications   Medication Sig Start Date End Date Taking? Authorizing Provider  albuterol (PROVENTIL HFA;VENTOLIN HFA) 108 (90 BASE) MCG/ACT inhaler Inhale 1-2 puffs into the lungs every 6 (six) hours as needed for wheezing or shortness of breath. 06/19/14   Arthor CaptainAbigail Harris, PA-C  amoxicillin (AMOXIL) 500 MG capsule Take 1 capsule (500 mg total) by mouth 3 (three) times daily. 07/24/14   Antony MaduraKelly Humes,  PA-C  clindamycin (CLEOCIN) 150 MG capsule Take 1 capsule (150 mg total) by mouth 4 (four) times daily. Patient not taking: Reported on 07/24/2014 04/28/12   Linna HoffJames D Kindl, MD  diclofenac (CATAFLAM) 50 MG tablet Take 1 tablet (50 mg total) by mouth 3 (three) times daily. Patient not taking: Reported on 07/24/2014 04/28/12   Linna HoffJames D Kindl, MD  HYDROcodone-acetaminophen (NORCO/VICODIN) 5-325 MG per tablet Take 1 tablet by mouth every 6 (six) hours as needed for pain. Patient not taking: Reported on 07/24/2014 02/23/13   Toy CookeyMegan Docherty, MD  medroxyPROGESTERone (DEPO-PROVERA) 150 MG/ML injection Inject 150 mg into the muscle every 3 (three) months.    Historical Provider, MD  naproxen (NAPROSYN) 500 MG tablet Take 1 tablet (500 mg total) by mouth 2 (two) times daily. Do not take with Motrin/ibuprofen 07/24/14   Antony MaduraKelly Humes, PA-C   BP 127/77 mmHg  Pulse 86  Temp(Src) 101 F (38.3 C)  Resp 16  SpO2 97%   Physical Exam  Constitutional: She is oriented to person, place, and time. She appears well-developed and well-nourished. No distress.  HENT:  Head: Normocephalic and atraumatic.  Right Ear: Tympanic membrane and ear canal normal.  Left Ear: Tympanic membrane and ear canal normal.  Enlarged and erythematous tonsils.  Eyes: Conjunctivae and EOM are normal.  Neck: Neck supple.  Cardiovascular: Normal rate, regular rhythm and normal heart sounds.   Pulmonary/Chest: Effort normal and breath sounds normal. No respiratory distress.  Musculoskeletal: Normal range of motion.  Neurological: She is alert and oriented to person, place, and time.  Skin: Skin is warm and dry.  Psychiatric: She has a normal mood and affect. Her behavior is normal.  Nursing note and vitals reviewed.   ED Course  Procedures (including critical care time)  DIAGNOSTIC STUDIES: Oxygen Saturation is 97% on RA, normal by my interpretation.    COORDINATION OF CARE: 9:27 PM-Discussed treatment plan which includes rapid strep test  and ibuprofen with pt at bedside and pt agreed to plan.   Labs Review Labs Reviewed - No data to display  Imaging Review No results found.   EKG Interpretation None      MDM  I counseled pt, illness sound viral.  Pt had a negative influenza test yesterday.  Pt was started on pcn for strep.     Final diagnoses:  Pharyngitis      Elson Areas, PA-C 08/24/14 2307  Jerelyn Scott, MD 08/24/14 [phone]

## 2014-08-24 NOTE — Discharge Instructions (Signed)
°Pharyngitis °Pharyngitis is redness, pain, and swelling (inflammation) of your pharynx.  °CAUSES  °Pharyngitis is usually caused by infection. Most of the time, these infections are from viruses (viral) and are part of a cold. However, sometimes pharyngitis is caused by bacteria (bacterial). Pharyngitis can also be caused by allergies. Viral pharyngitis may be spread from person to person by coughing, sneezing, and personal items or utensils (cups, forks, spoons, toothbrushes). Bacterial pharyngitis may be spread from person to person by more intimate contact, such as kissing.  °SIGNS AND SYMPTOMS  °Symptoms of pharyngitis include:   °· Sore throat.   °· Tiredness (fatigue).   °· Low-grade fever.   °· Headache. °· Joint pain and muscle aches. °· Skin rashes. °· Swollen lymph nodes. °· Plaque-like film on throat or tonsils (often seen with bacterial pharyngitis). °DIAGNOSIS  °Your health care provider will ask you questions about your illness and your symptoms. Your medical history, along with a physical exam, is often all that is needed to diagnose pharyngitis. Sometimes, a rapid strep test is done. Other lab tests may also be done, depending on the suspected cause.  °TREATMENT  °Viral pharyngitis will usually get better in 3-4 days without the use of medicine. Bacterial pharyngitis is treated with medicines that kill germs (antibiotics).  °HOME CARE INSTRUCTIONS  °· Drink enough water and fluids to keep your urine clear or pale yellow.   °· Only take over-the-counter or prescription medicines as directed by your health care provider:   °¨ If you are prescribed antibiotics, make sure you finish them even if you start to feel better.   °¨ Do not take aspirin.   °· Get lots of rest.   °· Gargle with 8 oz of salt water (½ tsp of salt per 1 qt of water) as often as every 1-2 hours to soothe your throat.   °· Throat lozenges (if you are not at risk for choking) or sprays may be used to soothe your throat. °SEEK MEDICAL  CARE IF:  °· You have large, tender lumps in your neck. °· You have a rash. °· You cough up green, yellow-brown, or bloody spit. °SEEK IMMEDIATE MEDICAL CARE IF:  °· Your neck becomes stiff. °· You drool or are unable to swallow liquids. °· You vomit or are unable to keep medicines or liquids down. °· You have severe pain that does not go away with the use of recommended medicines. °· You have trouble breathing (not caused by a stuffy nose). °MAKE SURE YOU:  °· Understand these instructions. °· Will watch your condition. °· Will get help right away if you are not doing well or get worse. °Document Released: 06/05/2005 Document Revised: 03/26/2013 Document Reviewed: 02/10/2013 °ExitCare® Patient Information ©2015 [employer]. This information is not intended to replace advice given to you by your health care provider. Make sure you discuss any questions you have with your health care provider. ° °Viral Infections °A viral infection can be caused by different types of viruses. Most viral infections are not serious and resolve on their own. However, some infections may cause severe symptoms and may lead to further complications. °SYMPTOMS °Viruses can frequently cause: °· Minor sore throat. °· Aches and pains. °· Headaches. °· Runny nose. °· Different types of rashes. °· Watery eyes. °· Tiredness. °· Cough. °· Loss of appetite. °· Gastrointestinal infections, resulting in nausea, vomiting, and diarrhea. °These symptoms do not respond to antibiotics because the infection is not caused by bacteria. However, you might catch a bacterial infection following the viral infection. This is sometimes called   superinfection." Symptoms of such a bacterial infection may include:  Worsening sore throat with pus and difficulty swallowing.  Swollen neck glands.  Chills and a high or persistent fever.  Severe headache.  Tenderness over the sinuses.  Persistent overall ill feeling (malaise), muscle aches, and tiredness  (fatigue).  Persistent cough.  Yellow, green, or brown mucus production with coughing. HOME CARE INSTRUCTIONS   Only take over-the-counter or prescription medicines for pain, discomfort, diarrhea, or fever as directed by your caregiver.  Drink enough water and fluids to keep your urine clear or pale yellow. Sports drinks can provide valuable electrolytes, sugars, and hydration.  Get plenty of rest and maintain proper nutrition. Soups and broths with crackers or rice are fine. SEEK IMMEDIATE MEDICAL CARE IF:   You have severe headaches, shortness of breath, chest pain, neck pain, or an unusual rash.  You have uncontrolled vomiting, diarrhea, or you are unable to keep down fluids.  You or your child has an oral temperature above 102 F (38.9 C), not controlled by medicine.  Your baby is older than 3 months with a rectal temperature of 102 F (38.9 C) or higher.  Your baby is 323 months old or younger with a rectal temperature of 100.4 F (38 C) or higher. MAKE SURE YOU:   Understand these instructions.  Will watch your condition.  Will get help right away if you are not doing well or get worse. Document Released: 03/15/2005 Document Revised: 08/28/2011 Document Reviewed: 10/10/2010 [facility] Patient Information 2015 [place], [place]. This information is not intended to replace advice given to you by your health care provider. Make sure you discuss any questions you have with your health care provider.

## 2014-08-27 LAB — CULTURE, GROUP A STREP: Strep A Culture: NEGATIVE

## 2015-09-28 ENCOUNTER — Encounter (HOSPITAL_COMMUNITY): Payer: Self-pay | Admitting: Emergency Medicine

## 2015-09-28 ENCOUNTER — Emergency Department (HOSPITAL_COMMUNITY)
Admission: EM | Admit: 2015-09-28 | Discharge: 2015-09-28 | Disposition: A | Discharge status: Home / Self Care | Payer: BLUE CROSS/BLUE SHIELD | Attending: Emergency Medicine | Admitting: Emergency Medicine

## 2015-09-28 ENCOUNTER — Emergency Department (HOSPITAL_COMMUNITY): Payer: BLUE CROSS/BLUE SHIELD

## 2015-09-28 DIAGNOSIS — Z792 Long term (current) use of antibiotics: Secondary | ICD-10-CM | POA: Insufficient documentation

## 2015-09-28 DIAGNOSIS — Z79899 Other long term (current) drug therapy: Secondary | ICD-10-CM | POA: Diagnosis not present

## 2015-09-28 DIAGNOSIS — M7591 Shoulder lesion, unspecified, right shoulder: Secondary | ICD-10-CM | POA: Diagnosis not present

## 2015-09-28 DIAGNOSIS — M25511 Pain in right shoulder: Secondary | ICD-10-CM | POA: Diagnosis present

## 2015-09-28 DIAGNOSIS — M7521 Bicipital tendinitis, right shoulder: Secondary | ICD-10-CM

## 2015-09-28 MED ORDER — HYDROCODONE-ACETAMINOPHEN 5-325 MG PO TABS: 1.0000 | ORAL_TABLET | Freq: Four times a day (QID) | ORAL | Status: DC | PRN

## 2015-09-28 MED ORDER — PREDNISONE 50 MG PO TABS: 50.0000 mg | ORAL_TABLET | Freq: Every day | ORAL | Status: DC

## 2015-09-28 NOTE — ED Notes (Signed)
See PA assessment - one touch 

## 2015-09-28 NOTE — Discharge Instructions (Signed)
Return here as needed.  Use ice and heat on your shoulder.  Follow-up with the orthopedist provided.  The x-rays did not show any significant abnormalities

## 2015-09-28 NOTE — ED Notes (Signed)
Patient states R shoulder pain x 1 1/2 weeks.  Patient was seen at urgent care for same last week.   Patient denies injury.   Patient states her arm feels additionally weaker with pain.   Patient denies other symptoms.

## 2015-09-28 NOTE — ED Provider Notes (Signed)
CSN: [id number]     Arrival date & time 09/28/15  [id number] History  By signing my name below, I, Essence Howell, attest that this documentation has been prepared under the direction and in the presence of Charlestine Nighthristopher Larence Thone, PA-C Electronically Signed: Charline BillsEssence Howell, ED Scribe 09/28/2015 at 11:06 AM.   Chief Complaint  Patient presents with  . Shoulder Pain   The history is provided by the patient. No language interpreter was used.   HPI Comments: Sherry Rios is a 25 y.o. female who presents to the Emergency Department complaining of gradually worsening right shoulder pain for the past 1.5 week. Pt was seen at urgent care last week for the same. She denies injury. She reports increased pain and weakness in her right arm onset this morning, specifically with opening a soda. Pain is exacerbated with palpation and movement. No treatments tried PTA.   No past medical history on file. No past surgical history on file. No family history on file. Social History  Substance Use Topics  . Smoking status: Never Smoker   . Smokeless tobacco: Never Used  . Alcohol Use: Yes     Comment: social   OB History    No data available     Review of Systems A complete 10 system review of systems was obtained and all systems are negative except as noted in the HPI and PMH.   Allergies  Sulfa antibiotics  Home Medications   Prior to Admission medications   Medication Sig Start Date End Date Taking? Authorizing Provider  albuterol (PROVENTIL HFA;VENTOLIN HFA) 108 (90 BASE) MCG/ACT inhaler Inhale 1-2 puffs into the lungs every 6 (six) hours as needed for wheezing or shortness of breath. 06/19/14   Arthor CaptainAbigail Harris, PA-C  amoxicillin (AMOXIL) 500 MG capsule Take 1 capsule (500 mg total) by mouth 3 (three) times daily. Patient not taking: Reported on 08/24/2014 07/24/14   Antony MaduraKelly Humes, PA-C  clindamycin (CLEOCIN) 150 MG capsule Take 1 capsule (150 mg total) by mouth 4 (four) times daily. Patient not taking:  Reported on 07/24/2014 04/28/12   Linna HoffJames D Kindl, MD  diclofenac (CATAFLAM) 50 MG tablet Take 1 tablet (50 mg total) by mouth 3 (three) times daily. Patient not taking: Reported on 07/24/2014 04/28/12   Linna HoffJames D Kindl, MD  HYDROcodone-acetaminophen (NORCO/VICODIN) 5-325 MG per tablet Take 1 tablet by mouth every 6 (six) hours as needed for pain. Patient not taking: Reported on 07/24/2014 02/23/13   Toy CookeyMegan Docherty, MD  medroxyPROGESTERone (DEPO-PROVERA) 150 MG/ML injection Inject 150 mg into the muscle every 3 (three) months. Last dose was in the beginning of February    Historical Provider, MD  naproxen (NAPROSYN) 500 MG tablet Take 1 tablet (500 mg total) by mouth 2 (two) times daily. Do not take with Motrin/ibuprofen Patient not taking: Reported on 08/24/2014 07/24/14   Antony MaduraKelly Humes, PA-C  PRESCRIPTION MEDICATION Take 1 tablet by mouth 2 (two) times daily. Medication: Penicillin    Historical Provider, MD   BP 110/84 mmHg  Pulse 73  Temp(Src) 97.5 F (36.4 C) (Oral)  Resp 16  Ht 5\' 2"  (1.575 m)  Wt 125 lb (56.7 kg)  BMI 22.86 kg/m2  SpO2 100% Physical Exam  Constitutional: She is oriented to person, place, and time. She appears well-developed and well-nourished. No distress.  HENT:  Head: Normocephalic and atraumatic.  Eyes: Conjunctivae and EOM are normal.  Neck: Neck supple. No tracheal deviation present.  Cardiovascular: Normal rate.   Pulmonary/Chest: Effort normal. No respiratory distress.  Musculoskeletal: Normal  range of motion.  Pain over the origin of the biceps tendon anteriorly. Pain on passive ROM. Bicep appears intact. Good strength bilaterally.   Neurological: She is alert and oriented to person, place, and time.  Skin: Skin is warm and dry.  Psychiatric: She has a normal mood and affect. Her behavior is normal.  Nursing note and vitals reviewed.  ED Course  Procedures (including critical care time) DIAGNOSTIC STUDIES: Oxygen Saturation is 100% on RA, normal by my  interpretation.    COORDINATION OF CARE: 9:53 AM-Discussed treatment plan which includes XR with pt at bedside and pt agreed to plan.   Labs Review Labs Reviewed - No data to display  Imaging Review Dg Shoulder Right  09/28/2015  CLINICAL DATA:  Right shoulder pain for 10 days, no known injury, the patient does repeated lifting at work EXAM: RIGHT SHOULDER - 2+ VIEW COMPARISON:  None. FINDINGS: Three views of the right shoulder submitted. No acute fracture or subluxation. The glenoid humeral joint and AC joint are preserved. No radiopaque foreign body. IMPRESSION: Negative. Electronically Signed   By: Natasha Mead M.D.   On: 09/28/2015 11:00   I have personally reviewed and evaluated these images and lab results as part of my medical decision-making.  Patient most likely has a biceps tendinitis.  The patient will be given pain control along with steroids.  Also sling for comfort.  Advised her to follow-up with orthopedics.  Told to return here as needed.  Patient agrees the plan  Charlestine Night, PA-C 09/28/15 1108  Derwood Kaplan, MD 09/29/15 [phone]

## 2016-10-27 IMAGING — CR DG CHEST 1V PORT
1 series · 1 of 1 positions shown · non-contrast
Comparison: None.

CLINICAL DATA: Initial encounter for left-sided chest pain and
shortness of breath started yesterday

EXAM:
PORTABLE CHEST - 1 VIEW

[AP]
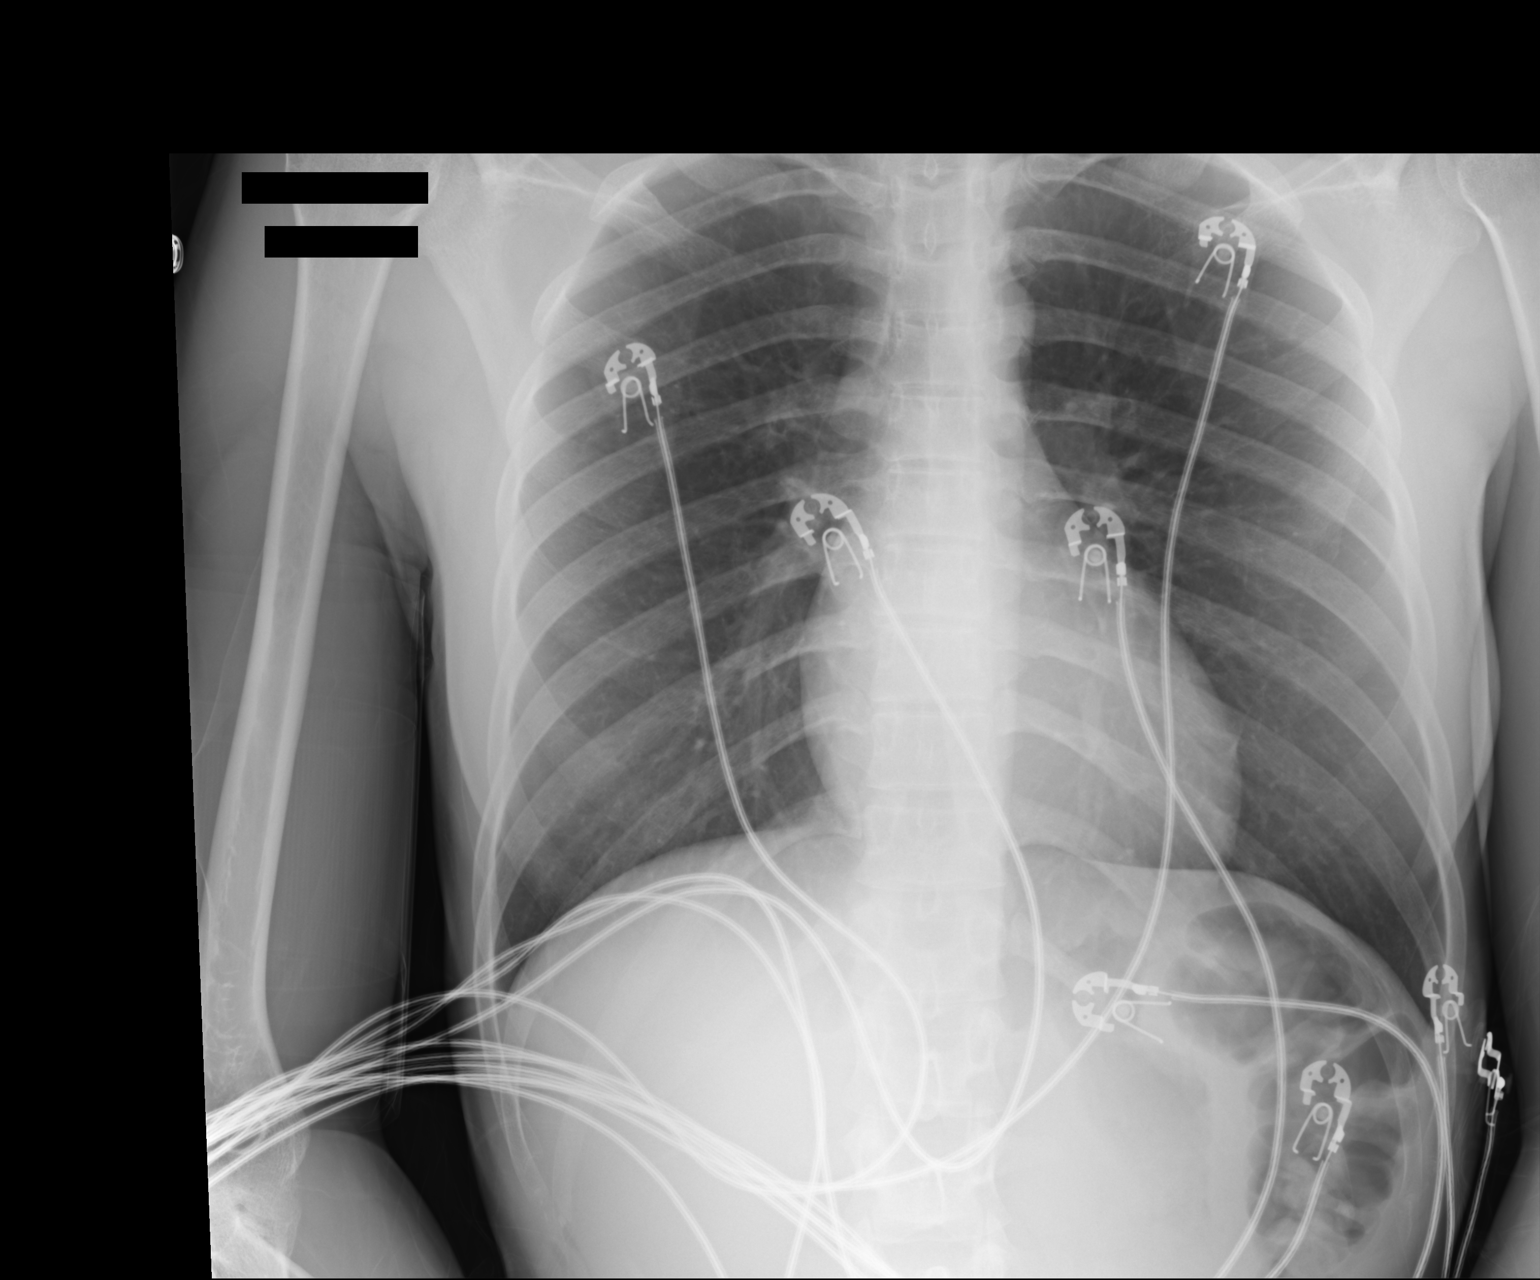

[1 of 1 positions shown; findings below may reference images not displayed]

FINDINGS: 6790 hrs. The lungs are clear without focal infiltrate, edema,
pneumothorax or pleural effusion. The cardiopericardial silhouette
is within normal limits for size. Imaged bony structures of the
thorax are intact. Telemetry leads overlie the chest.
IMPRESSION: Normal cardiopulmonary exam.

## 2018-02-05 ENCOUNTER — Encounter (HOSPITAL_COMMUNITY): Payer: Self-pay | Admitting: Emergency Medicine

## 2018-02-05 ENCOUNTER — Emergency Department (HOSPITAL_COMMUNITY)
Admission: EM | Admit: 2018-02-05 | Discharge: 2018-02-05 | Disposition: A | Discharge status: Home / Self Care | Payer: BLUE CROSS/BLUE SHIELD | Attending: Emergency Medicine | Admitting: Emergency Medicine

## 2018-02-05 DIAGNOSIS — M542 Cervicalgia: Secondary | ICD-10-CM | POA: Diagnosis not present

## 2018-02-05 DIAGNOSIS — Z79899 Other long term (current) drug therapy: Secondary | ICD-10-CM | POA: Insufficient documentation

## 2018-02-05 MED ORDER — PREDNISONE 20 MG PO TABS: ORAL_TABLET | ORAL | 0 refills | Status: DC

## 2018-02-05 NOTE — ED Triage Notes (Signed)
Patient to ED c/o neck and back pain - states pain is worse in her neck and moves down both arms sometimes with intermittent numbness in arms and hands. Also feels like she is having spasms all throughout her back. Worse with movement.

## 2018-02-05 NOTE — ED Provider Notes (Signed)
[facility] EMERGENCY DEPARTMENT Provider Note   CSN: [id number] Arrival date & time: 02/05/18  4098     History   Chief Complaint Chief Complaint  Patient presents with  . Back Pain    HPI Sherry Rios is a 27 y.o. female.  Patient is a 27 year old female who presents with neck pain.  She states it started about 6 months ago.  She has no known injury.  She has flareups every 2 weeks or so the last for a few days.  She states her pain is been worse over the last 2 to 3 days.  She describes pain along the left side of her neck that radiates a little bit down her back.  There is no radiation down her arms.  She has periodic tingling of all of her fingers.  She denies any current numbness.  She denies any weakness in her arms.  No numbness or weakness to the lower extremities.  She saw her primary care provider about 6 months ago when it started and she had x-rays of her neck which she says showed some straightening of her spine but otherwise was negative per her report.  She has not followed back up with her provider.  At that time she got prescription for methocarbamol and diclofenac.  She still takes that periodically with no improvement in symptoms.     History reviewed. No pertinent past medical history.  There are no active problems to display for this patient.   History reviewed. No pertinent surgical history.   OB History   None      Home Medications    Prior to Admission medications   Medication Sig Start Date End Date Taking? Authorizing Provider  albuterol (PROVENTIL HFA;VENTOLIN HFA) 108 (90 BASE) MCG/ACT inhaler Inhale 1-2 puffs into the lungs every 6 (six) hours as needed for wheezing or shortness of breath. 06/19/14   Arthor Captain, PA-C  amoxicillin (AMOXIL) 500 MG capsule Take 1 capsule (500 mg total) by mouth 3 (three) times daily. Patient not taking: Reported on 08/24/2014 07/24/14   Antony Madura, PA-C  clindamycin (CLEOCIN) 150 MG capsule  Take 1 capsule (150 mg total) by mouth 4 (four) times daily. Patient not taking: Reported on 07/24/2014 04/28/12   Linna Hoff, MD  diclofenac (CATAFLAM) 50 MG tablet Take 1 tablet (50 mg total) by mouth 3 (three) times daily. Patient not taking: Reported on 07/24/2014 04/28/12   Linna Hoff, MD  HYDROcodone-acetaminophen (NORCO/VICODIN) 5-325 MG tablet Take 1 tablet by mouth every 6 (six) hours as needed for moderate pain. 09/28/15   Lawyer, Cristal Deer, PA-C  medroxyPROGESTERone (DEPO-PROVERA) 150 MG/ML injection Inject 150 mg into the muscle every 3 (three) months. Last dose was in the beginning of February    [provider]  naproxen (NAPROSYN) 500 MG tablet Take 1 tablet (500 mg total) by mouth 2 (two) times daily. Do not take with Motrin/ibuprofen Patient not taking: Reported on 08/24/2014 07/24/14   Antony Madura, PA-C  predniSONE (DELTASONE) 20 MG tablet 3 tabs po day one, then 2 tabs daily x 4 days 02/05/18   Rolan Bucco, MD  PRESCRIPTION MEDICATION Take 1 tablet by mouth 2 (two) times daily. Medication: Penicillin    [provider]    Family History No family history on file.  Social History Social History   Tobacco Use  . Smoking status: Never Smoker  . Smokeless tobacco: Never Used  Substance Use Topics  . Alcohol use: Yes  Comment: social  . Drug use: No     Allergies   Sulfa antibiotics   Review of Systems Review of Systems  Constitutional: Negative for chills, diaphoresis, fatigue and fever.  HENT: Negative for congestion, rhinorrhea and sneezing.   Eyes: Negative.   Respiratory: Negative for cough, chest tightness and shortness of breath.   Cardiovascular: Negative for chest pain and leg swelling.  Gastrointestinal: Negative for abdominal pain, blood in stool, diarrhea, nausea and vomiting.  Genitourinary: Negative for difficulty urinating, flank pain, frequency and hematuria.  Musculoskeletal: Positive for neck pain. Negative for  arthralgias and back pain.  Skin: Negative for rash.  Neurological: Negative for dizziness, speech difficulty, weakness, numbness and headaches.     Physical Exam Updated Vital Signs BP 123/90   Pulse (!) 57   Temp 98.1 F (36.7 C) (Oral)   Resp 16   Ht 5\' 2"  (1.575 m)   Wt 56.7 kg   SpO2 100%   BMI 22.86 kg/m   Physical Exam  Constitutional: She is oriented to person, place, and time. She appears well-developed and well-nourished.  HENT:  Head: Normocephalic and atraumatic.  Eyes: Pupils are equal, round, and reactive to light.  Neck: Normal range of motion. Neck supple.  Positive tenderness in the left cervical paraspinal area.  There is no spinal tenderness.  She has full range of motion in the neck.  There is no tenderness to the thoracic or lumbosacral spine.  Cardiovascular: Normal rate, regular rhythm and normal heart sounds.  Pulmonary/Chest: Effort normal and breath sounds normal. No respiratory distress. She has no wheezes. She has no rales. She exhibits no tenderness.  Abdominal: Soft. Bowel sounds are normal. There is no tenderness. There is no rebound and no guarding.  Musculoskeletal: Normal range of motion. She exhibits no edema.  Lymphadenopathy:    She has no cervical adenopathy.  Neurological: She is alert and oriented to person, place, and time.  Motor 5 out of 5 all extremities, sensation grossly intact light touch all extremities, radial pulses are intact bilaterally  Skin: Skin is warm and dry. No rash noted.  Psychiatric: She has a normal mood and affect.     ED Treatments / Results  Labs (all labs ordered are listed, but only abnormal results are displayed) Labs Reviewed - No data to display  EKG None  Radiology No results found.  Procedures Procedures (including critical care time)  Medications Ordered in ED Medications - No data to display   Initial Impression / Assessment and Plan / ED Course  I have reviewed the triage vital signs  and the nursing notes.  Pertinent labs & imaging results that were available during my care of the patient were reviewed by me and considered in my medical decision making (see chart for details).     Patient presents with left side, nonradicular neck pain.  She is neurologically intact.  She has no recent trauma.  She states she already had imaging of her neck.  I will try a prednisone pack.  I advised her not to take the diclofenac while she is on the prednisone but she can continue the muscle relaxer.  I encouraged her to follow back up with her PCP and discuss possible MRI if her symptoms are continuing.  Final Clinical Impressions(s) / ED Diagnoses   Final diagnoses:  Neck pain    ED Discharge Orders         Ordered    predniSONE (DELTASONE) 20 MG tablet  02/05/18 0827           Rolan BuccoBelfi, Abdi Husak, MD 02/05/18 [phone]

## 2020-05-21 LAB — OB RESULTS CONSOLE HGB/HCT, BLOOD: Hemoglobin: 12.6

## 2020-05-21 LAB — OB RESULTS CONSOLE RUBELLA ANTIBODY, IGM: Rubella: IMMUNE

## 2020-05-21 LAB — OB RESULTS CONSOLE PLATELET COUNT: Platelets: 285

## 2020-05-21 LAB — OB RESULTS CONSOLE HIV ANTIBODY (ROUTINE TESTING): HIV: NONREACTIVE

## 2020-05-21 LAB — OB RESULTS CONSOLE HEPATITIS B SURFACE ANTIGEN: Hepatitis B Surface Ag: NEGATIVE

## 2020-05-21 LAB — HEPATITIS C ANTIBODY: HCV Ab: NEGATIVE

## 2020-06-19 NOTE — L&D Delivery Note (Signed)
Delivery Note   Patient Name: Sherry Rios DOB: [birth year] MRN: [id number]  Date of admission: 12/30/2020 Delivering MD: Osborn Coho  Date of delivery: 12/30/20 Type of delivery: SVD  Newborn Data: Live born female  Birth Weight:   APGAR: 8, 9   Newborn Delivery   Birth date/time: 12/30/2020 08:50:00 Delivery type: Vaginal, Vacuum (Extractor)     [name], 30 y.o., @ [redacted]w[redacted]d,  G2P0010, who was admitted for latent spontaneous labor at 3cm, with rapid development to active labor after SROM thiskc meconium. I was called to the room when she progressed 2+ station in the second stage of labor.  She pushed for 10/min with deep variables and prolonged declel noted to nadir of 60, return to baseline of 140s with maternal position change, DR Su Hilt called, pt to  labor down, DR Su Hilt was en route for vacuum assist assessment. Verbal consent: obtained from patient.  Risks and benefits discussed in detail.  Risks include, but are not limited to the risks of anesthesia, bleeding, infection, damage to maternal tissues, fetal cephalhematoma.  There is also the risk of inability to effect vaginal delivery of the head, or shoulder dystocia that cannot be resolved by established maneuvers, leading to the need for emergency cesarean section. NICU was called to attend vacuume and heavy meconium delivery. DR Su Hilt in room, decl noted with next push, DR Su Hilt used the Mighty vac with 50 cm (Green zone)  fetal head was delivered on the third cxt with VAVD without complications.  She delivered a viable infant, cephalic and restituted to the LOA position over an intact perineum.  A nuchal cord   was not identified. The baby was placed on maternal abdomen while initial step of NRP were perfmored (Dry, Stimulated, and warmed). Hat placed on baby for thermoregulation. Delayed cord clamping was performed for 30 seconds.  Cord double clamped and cut.  Cord cut by father. Apgar scores were 8 and 9. Prophylactic  Pitocin was started in the third stage of labor for active management. The placenta delivered spontaneously, shultz, with a 3 vessel cord and was sent to LD.  Inspection revealed 2nd degree and sulcus. An examination of the vaginal vault and cervix was free from lacerations. The uterus was firm, bleeding stable.  The repair was done under epidural.   Umbilical artery blood gas were sent.  There were no complications during the procedure.  Mom and baby skin to skin following delivery. Left in stable condition.  Maternal Info: Anesthesia: Epidural Episiotomy: NO Lacerations:  2nd and suclus Suture Repair: 2.0 CT and 3.0 SH Est. Blood Loss (mL):   Newborn Info:  Baby Sex: female Circumcision: in pt desired Babies Name: [name] APGAR (1 MIN):  8 APGAR (5 MINS):  9 APGAR (10 MINS):     Mom to postpartum.  Baby to Couplet care / Skin to Skin.  Dr Su Hilt aware and present for delivery  [facility], [place], NP-C 12/30/20 9:31 AM

## 2020-06-23 ENCOUNTER — Telehealth: Payer: Self-pay | Admitting: Obstetrics and Gynecology

## 2020-06-23 ENCOUNTER — Other Ambulatory Visit: Payer: Self-pay

## 2020-06-23 ENCOUNTER — Inpatient Hospital Stay (HOSPITAL_COMMUNITY)
Admission: AD | Admit: 2020-06-23 | Discharge: 2020-06-23 | Disposition: A | Discharge status: Home / Self Care | Payer: BC Managed Care – PPO | Attending: Obstetrics & Gynecology | Admitting: Obstetrics & Gynecology

## 2020-06-23 ENCOUNTER — Encounter (HOSPITAL_COMMUNITY): Payer: Self-pay | Admitting: *Deleted

## 2020-06-23 DIAGNOSIS — R112 Nausea with vomiting, unspecified: Secondary | ICD-10-CM

## 2020-06-23 DIAGNOSIS — B349 Viral infection, unspecified: Secondary | ICD-10-CM

## 2020-06-23 DIAGNOSIS — Z3A12 12 weeks gestation of pregnancy: Secondary | ICD-10-CM | POA: Insufficient documentation

## 2020-06-23 DIAGNOSIS — R519 Headache, unspecified: Secondary | ICD-10-CM | POA: Insufficient documentation

## 2020-06-23 DIAGNOSIS — R509 Fever, unspecified: Secondary | ICD-10-CM | POA: Diagnosis not present

## 2020-06-23 DIAGNOSIS — O26891 Other specified pregnancy related conditions, first trimester: Secondary | ICD-10-CM | POA: Insufficient documentation

## 2020-06-23 DIAGNOSIS — O26899 Other specified pregnancy related conditions, unspecified trimester: Secondary | ICD-10-CM

## 2020-06-23 DIAGNOSIS — Z20822 Contact with and (suspected) exposure to covid-19: Secondary | ICD-10-CM | POA: Insufficient documentation

## 2020-06-23 DIAGNOSIS — R1030 Lower abdominal pain, unspecified: Secondary | ICD-10-CM | POA: Insufficient documentation

## 2020-06-23 DIAGNOSIS — O99891 Other specified diseases and conditions complicating pregnancy: Secondary | ICD-10-CM

## 2020-06-23 DIAGNOSIS — R109 Unspecified abdominal pain: Secondary | ICD-10-CM

## 2020-06-23 DIAGNOSIS — M549 Dorsalgia, unspecified: Secondary | ICD-10-CM | POA: Insufficient documentation

## 2020-06-23 LAB — WET PREP, GENITAL
Clue Cells Wet Prep HPF POC: NONE SEEN
Sperm: NONE SEEN
Trich, Wet Prep: NONE SEEN
Yeast Wet Prep HPF POC: NONE SEEN

## 2020-06-23 LAB — CBC WITH DIFFERENTIAL/PLATELET
Abs Immature Granulocytes: 0.02 10*3/uL (ref 0.00–0.07)
Basophils Absolute: 0 10*3/uL (ref 0.0–0.1)
Basophils Relative: 0 %
Eosinophils Absolute: 0 10*3/uL (ref 0.0–0.5)
Eosinophils Relative: 0 %
HCT: 30.5 % — ABNORMAL LOW (ref 36.0–46.0)
Hemoglobin: 10.4 g/dL — ABNORMAL LOW (ref 12.0–15.0)
Immature Granulocytes: 0 %
Lymphocytes Relative: 6 %
Lymphs Abs: 0.3 10*3/uL — ABNORMAL LOW (ref 0.7–4.0)
MCH: 27.2 pg (ref 26.0–34.0)
MCHC: 34.1 g/dL (ref 30.0–36.0)
MCV: 79.6 fL — ABNORMAL LOW (ref 80.0–100.0)
Monocytes Absolute: 0.7 10*3/uL (ref 0.1–1.0)
Monocytes Relative: 13 %
Neutro Abs: 4.5 10*3/uL (ref 1.7–7.7)
Neutrophils Relative %: 81 %
Platelets: 220 10*3/uL (ref 150–400)
RBC: 3.83 MIL/uL — ABNORMAL LOW (ref 3.87–5.11)
RDW: 12.5 % (ref 11.5–15.5)
WBC: 5.5 10*3/uL (ref 4.0–10.5)
nRBC: 0 % (ref 0.0–0.2)

## 2020-06-23 LAB — COMPREHENSIVE METABOLIC PANEL
ALT: 14 U/L (ref 0–44)
AST: 21 U/L (ref 15–41)
Albumin: 3.7 g/dL (ref 3.5–5.0)
Alkaline Phosphatase: 33 U/L — ABNORMAL LOW (ref 38–126)
Anion gap: 10 (ref 5–15)
BUN: 6 mg/dL (ref 6–20)
CO2: 21 mmol/L — ABNORMAL LOW (ref 22–32)
Calcium: 9.6 mg/dL (ref 8.9–10.3)
Chloride: 102 mmol/L (ref 98–111)
Creatinine, Ser: 0.7 mg/dL (ref 0.44–1.00)
GFR, Estimated: >60 mL/min (ref >60)
Glucose, Bld: 81 mg/dL (ref 70–99)
Potassium: 4 mmol/L (ref 3.5–5.1)
Sodium: 133 mmol/L — ABNORMAL LOW (ref 135–145)
Total Bilirubin: 0.5 mg/dL (ref 0.3–1.2)
Total Protein: 6.8 g/dL (ref 6.5–8.1)

## 2020-06-23 LAB — URINALYSIS, ROUTINE W REFLEX MICROSCOPIC
Bilirubin Urine: NEGATIVE
Glucose, UA: NEGATIVE mg/dL
Hgb urine dipstick: NEGATIVE
Ketones, ur: 20 mg/dL — AB
Nitrite: NEGATIVE
Protein, ur: NEGATIVE mg/dL
Specific Gravity, Urine: 1.017 (ref 1.005–1.030)
pH: 6 (ref 5.0–8.0)

## 2020-06-23 LAB — SARS CORONAVIRUS 2 (TAT 6-24 HRS): SARS Coronavirus 2: NEGATIVE

## 2020-06-23 LAB — C-REACTIVE PROTEIN: CRP: 0.9 mg/dL (ref <1.0)

## 2020-06-23 MED ORDER — ACETAMINOPHEN 500 MG PO TABS
1000.0000 mg | ORAL_TABLET | ORAL | Status: AC
  Administered 2020-06-23: 1000 mg via ORAL
  Filled 2020-06-23: qty 2

## 2020-06-23 MED ORDER — LACTATED RINGERS IV BOLUS
1000.0000 mL | Freq: Once | INTRAVENOUS | Status: AC
  Administered 2020-06-23: 1000 mL via INTRAVENOUS

## 2020-06-23 MED ORDER — METOCLOPRAMIDE HCL 5 MG/ML IJ SOLN
10.0000 mg | Freq: Four times a day (QID) | INTRAMUSCULAR | Status: DC | PRN
  Administered 2020-06-23: 10 mg via INTRAVENOUS
  Filled 2020-06-23: qty 2

## 2020-06-23 MED ORDER — FLUTICASONE PROPIONATE 50 MCG/ACT NA SUSP: 1.0000 | Freq: Every day | NASAL | 1 refills | Status: DC

## 2020-06-23 MED ORDER — METOCLOPRAMIDE HCL 10 MG PO TABS: 10.0000 mg | ORAL_TABLET | Freq: Four times a day (QID) | ORAL | 2 refills | Status: DC | PRN

## 2020-06-23 MED ORDER — ACETAMINOPHEN 500 MG PO TABS: 1000.0000 mg | ORAL_TABLET | Freq: Three times a day (TID) | ORAL | 2 refills | Status: DC | PRN

## 2020-06-23 MED ORDER — CEFADROXIL 500 MG PO CAPS: 1000.0000 mg | ORAL_CAPSULE | Freq: Two times a day (BID) | ORAL | 0 refills | Status: AC

## 2020-06-23 NOTE — MAU Provider Note (Signed)
History     CSN: [id number]  Arrival date and time: 06/23/20 1227   None     Chief Complaint  Patient presents with  . Back Pain  . Emesis  . Nausea  . Abdominal Cramping   HPI 30 y/o g2p0010 at [redacted]w[redacted]d who presents with headache, vomiting, abdominal pain. Reports acute onset of symptoms at 0800 this morning. Describes lower abdominal pain and back pain that is constant. No pain with urination. No increased urinary frequency. No abnormal vaginal discharge. She is not tolerating PO intake and has vomited all intake today. Denies diarrhea. Also reports a bad headache. Noted to have a fever on arrival but did not know she had a fever prior to being here. No cough or shortness of breath. Not covid vaccinated. No known sick contacts.    OB History    Gravida  2   Para      Term      Preterm      AB  1   Living        SAB      IAB  1   Ectopic      Multiple      Live Births              History reviewed. No pertinent past medical history.  History reviewed. No pertinent surgical history.  History reviewed. No pertinent family history.  Social History   Tobacco Use  . Smoking status: Never Smoker  . Smokeless tobacco: Never Used  Vaping Use  . Vaping Use: Never used  Substance Use Topics  . Alcohol use: Not Currently    Comment: social  . Drug use: No    Allergies:  Allergies  Allergen Reactions  . Sulfa Antibiotics Other (See Comments)    Childhood allergy     Medications Prior to Admission  Medication Sig Dispense Refill Last Dose  . albuterol (PROVENTIL HFA;VENTOLIN HFA) 108 (90 BASE) MCG/ACT inhaler Inhale 1-2 puffs into the lungs every 6 (six) hours as needed for wheezing or shortness of breath. 1 Inhaler 0   . amoxicillin (AMOXIL) 500 MG capsule Take 1 capsule (500 mg total) by mouth 3 (three) times daily. (Patient not taking: Reported on 08/24/2014) 30 capsule 0   . clindamycin (CLEOCIN) 150 MG capsule Take 1 capsule (150 mg total) by mouth 4  (four) times daily. (Patient not taking: Reported on 07/24/2014) 28 capsule 0   . diclofenac (CATAFLAM) 50 MG tablet Take 1 tablet (50 mg total) by mouth 3 (three) times daily. (Patient not taking: Reported on 07/24/2014) 21 tablet 0   . HYDROcodone-acetaminophen (NORCO/VICODIN) 5-325 MG tablet Take 1 tablet by mouth every 6 (six) hours as needed for moderate pain. 15 tablet 0   . medroxyPROGESTERone (DEPO-PROVERA) 150 MG/ML injection Inject 150 mg into the muscle every 3 (three) months. Last dose was in the beginning of February     . naproxen (NAPROSYN) 500 MG tablet Take 1 tablet (500 mg total) by mouth 2 (two) times daily. Do not take with Motrin/ibuprofen (Patient not taking: Reported on 08/24/2014) 30 tablet 0   . predniSONE (DELTASONE) 20 MG tablet 3 tabs po day one, then 2 tabs daily x 4 days 11 tablet 0   . PRESCRIPTION MEDICATION Take 1 tablet by mouth 2 (two) times daily. Medication: Penicillin       Review of Systems  Constitutional: Positive for chills, fatigue and fever.  Gastrointestinal: Positive for abdominal pain, nausea and vomiting. Negative for constipation  and diarrhea.  Genitourinary: Negative for vaginal bleeding and vaginal discharge.  Musculoskeletal: Positive for back pain.  Neurological: Positive for dizziness and headaches.   Physical Exam   Blood pressure 116/69, pulse (!) 118, temperature (!) 101.5 F (38.6 C), temperature source Oral, resp. rate 20, height 5\' 2"  (1.575 m), weight 51.8 kg, last menstrual period 03/26/2020, SpO2 100 %.  Physical Exam Vitals and nursing note reviewed.  Constitutional:      Appearance: Normal appearance.  Cardiovascular:     Pulses: Normal pulses.  Pulmonary:     Effort: Pulmonary effort is normal.  Abdominal:     Palpations: Abdomen is soft.     Comments: BS+, nondistended, soft, Suprapubic tenderness on exam. No flank pain. Tender to palpation of iliac crest b/l   Skin:    General: Skin is warm and dry.  Neurological:      Mental Status: She is alert.  Psychiatric:        Mood and Affect: Mood normal.        Behavior: Behavior normal.     MAU Course  Procedures  MDM Febrile to 102. Pulse 118. FHR 166 Tylenol, reglan, 1L IVF  CBC, CMP, CRP  UA w trace leukocytes, no nitrites  On reassessment patient feeling improved, eating graham crackers.  Discussed likely covid positive, will also follow up with urine culture results     Assessment and Plan   Fever, headache, abdominal pain in first trimester -suspect covid, discussed self care and return precautions. Also consider UTI given suprapubic tenderness, however no flank pain, no dysuria, indeterminate UA. Will f/u culture results.  -script sent for reglan, tylenol  -patient stable for d/c home   05/26/2020 06/23/2020, 1:54 PM

## 2020-06-23 NOTE — Telephone Encounter (Signed)
Patient covid result came back negative after discharge. Based on findings, will treat presumptively for pyelonephritis. Script sent for duricef 1000mg  BID x10 days for suspected pyelonephritis. Contacted patient and discussed care plan and she voices understanding.    , MD [facility] Family Medicine Fellow, [facility] for [facility], [facility]

## 2020-06-23 NOTE — MAU Note (Signed)
Presents with c/o N/V, reports unable to keep anything down.  States vomits everyday, but approximately 5x today.  States also having lower abdominal cramping & back pain that began at 0800 this morning.  Denies VB.  LMP 03/26/2020.

## 2020-06-24 LAB — GC/CHLAMYDIA PROBE AMP (~~LOC~~) NOT AT ARMC
Chlamydia: NEGATIVE
Comment: NEGATIVE
Comment: NORMAL
Neisseria Gonorrhea: NEGATIVE

## 2020-06-25 LAB — URINE CULTURE: Culture: NO GROWTH

## 2020-08-19 ENCOUNTER — Other Ambulatory Visit: Payer: Self-pay | Admitting: Obstetrics and Gynecology

## 2020-09-15 ENCOUNTER — Encounter (HOSPITAL_COMMUNITY): Payer: Self-pay | Admitting: Obstetrics and Gynecology

## 2020-09-15 ENCOUNTER — Inpatient Hospital Stay (HOSPITAL_COMMUNITY)
Admission: AD | Admit: 2020-09-15 | Discharge: 2020-09-15 | Disposition: A | Discharge status: Home / Self Care | Payer: BC Managed Care – PPO | Attending: Obstetrics and Gynecology | Admitting: Obstetrics and Gynecology

## 2020-09-15 ENCOUNTER — Other Ambulatory Visit: Payer: Self-pay

## 2020-09-15 DIAGNOSIS — O99512 Diseases of the respiratory system complicating pregnancy, second trimester: Secondary | ICD-10-CM

## 2020-09-15 DIAGNOSIS — R0602 Shortness of breath: Secondary | ICD-10-CM | POA: Insufficient documentation

## 2020-09-15 DIAGNOSIS — O26899 Other specified pregnancy related conditions, unspecified trimester: Secondary | ICD-10-CM

## 2020-09-15 DIAGNOSIS — O26892 Other specified pregnancy related conditions, second trimester: Secondary | ICD-10-CM | POA: Insufficient documentation

## 2020-09-15 DIAGNOSIS — Z3A24 24 weeks gestation of pregnancy: Secondary | ICD-10-CM

## 2020-09-15 HISTORY — DX: Headache, unspecified: R51.9

## 2020-09-15 NOTE — MAU Note (Signed)
Presents stating she was instructed by her MD to go to MAU for c/o SOB.  States she was advised on Monday, but was working & couldn't come..  Reports SOB today, has an inhaler but didn't use it prior to coming.  Denies diagnosis of asthma. Denies LOF or VB.  Endorses +FM.

## 2020-09-15 NOTE — Discharge Instructions (Signed)

## 2020-09-15 NOTE — MAU Provider Note (Addendum)
History     CSN: [id number]  Arrival date and time: 09/15/20 1194   Event Date/Time   First Provider Initiated Contact with Patient 09/15/20 1032      Chief Complaint  Patient presents with   Shortness of Breath   Sherry Rios is a 30 y.o. G2P0010 at [redacted]w[redacted]d who presents today with shortness of breath. She states that this has been ongoing for about 3 weeks. She states that it is worse after eating and better after loosening her bra. She was seen at urgent care on 3/25 and had a negative covid test at that time. She has used an inhaler in the past. She has been prescribed an inhaler for the last 4-5 years. She states that she has been to the hospital several times for shortness of breath over the last 4-5 years and gotten an inhaler. She states that she used the inhaler on 3/25, but it didn't help. She reports that she does often have allergy symptoms this time of year. She denies any contractions, vaginal bleeding or leaking fluid. She reports normal fetal movement. Next OB appt is 10/08/20.   Shortness of Breath This is a new problem. The current episode started 1 to 4 weeks ago. The problem occurs intermittently. The problem has been unchanged. Pertinent negatives include no fever, rhinorrhea, sore throat or vomiting. Nothing aggravates the symptoms. The patient has no known risk factors for DVT/PE. She has tried beta agonist inhalers and body position changes for the symptoms. The treatment provided mild relief. (Reactive airway disease )    OB History     Gravida  2   Para      Term      Preterm      AB  1   Living         SAB      IAB  1   Ectopic      Multiple      Live Births              Past Medical History:  Diagnosis Date   Headache    Mograines    Past Surgical History:  Procedure Laterality Date   NO PAST SURGERIES      Family History  Problem Relation Age of Onset   Hypertension Mother    Asthma Mother    Healthy Father     Social  History   Tobacco Use   Smoking status: Never Smoker   Smokeless tobacco: Never Used  Vaping Use   Vaping Use: Never used  Substance Use Topics   Alcohol use: Not Currently    Comment: social   Drug use: No    Allergies:  Allergies  Allergen Reactions   Reglan [Metoclopramide]    Sulfa Antibiotics Other (See Comments)    Childhood allergy     Medications Prior to Admission  Medication Sig Dispense Refill Last Dose   acetaminophen (TYLENOL) 500 MG tablet Take 2 tablets (1,000 mg total) by mouth every 8 (eight) hours as needed for moderate pain, fever or headache. 100 tablet 2 More than a month at Unknown time   albuterol (PROVENTIL HFA;VENTOLIN HFA) 108 (90 BASE) MCG/ACT inhaler Inhale 1-2 puffs into the lungs every 6 (six) hours as needed for wheezing or shortness of breath. 1 Inhaler 0 09/10/2020   fluticasone (FLONASE) 50 MCG/ACT nasal spray SHAKE LIQUID AND USE 1 SPRAY IN EACH NOSTRIL DAILY 16 g 4    metoCLOPramide (REGLAN) 10 MG tablet Take 1 tablet (10  mg total) by mouth 4 (four) times daily as needed for nausea or vomiting. 30 tablet 2    PRESCRIPTION MEDICATION Take 1 tablet by mouth 2 (two) times daily. Medication: Penicillin       Review of Systems  Constitutional: Negative for chills and fever.  HENT: Negative for congestion, rhinorrhea and sore throat.   Respiratory: Positive for shortness of breath. Negative for cough.   Gastrointestinal: Negative for nausea and vomiting.  Genitourinary: Negative for dysuria, pelvic pain and vaginal bleeding.   Physical Exam   Blood pressure 112/67, pulse 74, temperature 98.1 F (36.7 C), temperature source Oral, resp. rate 20, height 5\' 2"  (1.575 m), weight 55.3 kg, last menstrual period 03/26/2020, SpO2 100 %.  Physical Exam Vitals and nursing note reviewed.  Constitutional:      General: She is not in acute distress. HENT:     Head: Normocephalic.  Eyes:     Pupils: Pupils are equal, round, and reactive to light.   Cardiovascular:     Rate and Rhythm: Normal rate and regular rhythm.     Heart sounds: Normal heart sounds.  Pulmonary:     Effort: Pulmonary effort is normal. No respiratory distress.     Breath sounds: Normal breath sounds. No stridor. No wheezing, rhonchi or rales.  Chest:     Chest wall: No tenderness.  Abdominal:     Palpations: Abdomen is soft.     Tenderness: There is no abdominal tenderness.  Skin:    General: Skin is warm and dry.  Neurological:     Mental Status: She is alert and oriented to person, place, and time.  Psychiatric:        Mood and Affect: Mood normal.        Behavior: Behavior normal.       NST:  Baseline: 135 Variability: moderate Accels: 10x10 Decels: random variable  Toco: random ctx less than 30 secs  Reactive/Appropriate for GA   MAU Course  Procedures  MDM Patient reassured that O2 sat is normal, and she is not displaying any signs of respiratory distress. Since this has been an ongoing issue over the last 3 weeks and worse with eating it seems to be the mechanical force of the pregnancy/GI track causing normal pregnancy shortness of breath. Ok to use inhaler PRN in pregnancy, but this hasn't helped and likely won't help with pregnancy related normal physiologic changes.   Assessment and Plan   1. Shortness of breath due to pregnancy   2. [redacted] weeks gestation of pregnancy    DC home in stable condition 3rd Trimester precautions  PTL precautions  Fetal kick counts RX: none  Return to MAU as needed FU with OB as planned   Follow-up Information     [facility] Follow up.   Specialty: Obstetrics and Gynecology Contact information: [address] [place] [phone]               [phone] DNP, CNM  09/15/20  11:48 AM    Attestation of Attending Supervision of Advanced Practice Provider (PA/CNM/NP): Evaluation and management procedures were  performed by the Advanced Practice Provider under my supervision and collaboration.  I have reviewed the Advanced Practice Provider's note and chart, and I agree with the management and plan. I have also made any necessary editorial changes.   09/17/20, DO Attending Obstetrician & Gynecologist, [facility] for [facility], [facility] 09/15/2020 4:26  PM

## 2020-11-01 ENCOUNTER — Other Ambulatory Visit: Payer: Self-pay

## 2020-11-01 ENCOUNTER — Encounter (HOSPITAL_COMMUNITY): Payer: Self-pay | Admitting: Obstetrics & Gynecology

## 2020-11-01 ENCOUNTER — Inpatient Hospital Stay (HOSPITAL_COMMUNITY)
Admission: AD | Admit: 2020-11-01 | Discharge: 2020-11-01 | Disposition: A | Discharge status: Home / Self Care | Payer: BC Managed Care – PPO | Attending: Obstetrics and Gynecology | Admitting: Obstetrics and Gynecology

## 2020-11-01 DIAGNOSIS — O4703 False labor before 37 completed weeks of gestation, third trimester: Secondary | ICD-10-CM

## 2020-11-01 DIAGNOSIS — Z3A31 31 weeks gestation of pregnancy: Secondary | ICD-10-CM | POA: Insufficient documentation

## 2020-11-01 DIAGNOSIS — O288 Other abnormal findings on antenatal screening of mother: Secondary | ICD-10-CM

## 2020-11-01 LAB — URINALYSIS, ROUTINE W REFLEX MICROSCOPIC
Bilirubin Urine: NEGATIVE
Glucose, UA: NEGATIVE mg/dL
Hgb urine dipstick: NEGATIVE
Ketones, ur: 20 mg/dL — AB
Leukocytes,Ua: NEGATIVE
Nitrite: NEGATIVE
Protein, ur: NEGATIVE mg/dL
Specific Gravity, Urine: 1.006 (ref 1.005–1.030)
pH: 6 (ref 5.0–8.0)

## 2020-11-01 LAB — WET PREP, GENITAL
Clue Cells Wet Prep HPF POC: NONE SEEN
Sperm: NONE SEEN
Trich, Wet Prep: NONE SEEN
Yeast Wet Prep HPF POC: NONE SEEN

## 2020-11-01 MED ORDER — LACTATED RINGERS IV BOLUS
1000.0000 mL | Freq: Once | INTRAVENOUS | Status: AC
  Administered 2020-11-01: 1000 mL via INTRAVENOUS

## 2020-11-01 NOTE — MAU Note (Signed)
Started contracting about prior to arrival by EMS.  Denies bleeding or leaking.  Pt states started with vomiting, then the pains started. Had routine OB appt this morning, everything was fine.

## 2020-11-01 NOTE — Discharge Instructions (Signed)

## 2020-11-01 NOTE — MAU Provider Note (Signed)
Patient Sherry Rios is a 30 y.o. G2P0010 at [redacted]w[redacted]d here with complaints of abdominal pain/contractions that started at 1530. She reports that she threw up at 1515; she denies vaginal bleeding, vaginal discharge, LOF, decreased fetal movements.   She reports that she only had strawberries today, and some water and a soda. She had a Sprite at 1445 with her iron pills, and she never drinks Sprite or sodas. She had an ob appt this morning at 1020. She didn't have any abdominal pain when she went but she was concerned about her discharge. Her Ob performed a speculum exam and took vaginal cultures. They told her "everything was normal".   She also walked around [employer] today.   History     CSN: [id number]  Arrival date and time: 11/01/20 1638   Event Date/Time   First Provider Initiated Contact with Patient 11/01/20 1655      Chief Complaint  Patient presents with  . Abdominal Pain  . Emesis   Abdominal Pain This is a new problem. The onset quality is sudden. The problem occurs intermittently. Pain location: when she called EMS it was at the top of her stomach, when she got to MAU it was in her suprapubic region.  The pain is at a severity of 7/10. Associated symptoms include vomiting. Pertinent negatives include no constipation, diarrhea, dysuria, fever or nausea. Relieved by: resting and eating something.    OB History    Gravida  2   Para      Term      Preterm      AB  1   Living        SAB      IAB  1   Ectopic      Multiple      Live Births              Past Medical History:  Diagnosis Date  . Headache    Mograines    Past Surgical History:  Procedure Laterality Date  . NO PAST SURGERIES      Family History  Problem Relation Age of Onset  . Hypertension Mother   . Asthma Mother   . Healthy Father     Social History   Tobacco Use  . Smoking status: Never Smoker  . Smokeless tobacco: Never Used  Vaping Use  . Vaping Use: Never used   Substance Use Topics  . Alcohol use: Not Currently    Comment: social  . Drug use: No    Allergies:  Allergies  Allergen Reactions  . Reglan [Metoclopramide]   . Sulfa Antibiotics Other (See Comments)    Childhood allergy     Medications Prior to Admission  Medication Sig Dispense Refill Last Dose  . acetaminophen (TYLENOL) 500 MG tablet Take 2 tablets (1,000 mg total) by mouth every 8 (eight) hours as needed for moderate pain, fever or headache. 100 tablet 2   . albuterol (PROVENTIL HFA;VENTOLIN HFA) 108 (90 BASE) MCG/ACT inhaler Inhale 1-2 puffs into the lungs every 6 (six) hours as needed for wheezing or shortness of breath. 1 Inhaler 0   . fluticasone (FLONASE) 50 MCG/ACT nasal spray SHAKE LIQUID AND USE 1 SPRAY IN EACH NOSTRIL DAILY 16 g 4   . metoCLOPramide (REGLAN) 10 MG tablet Take 1 tablet (10 mg total) by mouth 4 (four) times daily as needed for nausea or vomiting. 30 tablet 2   . PRESCRIPTION MEDICATION Take 1 tablet by mouth 2 (two) times daily. Medication: Penicillin  Review of Systems  Constitutional: Negative for fever.  HENT: Negative.   Respiratory: Negative.   Cardiovascular: Negative.   Gastrointestinal: Positive for abdominal pain and vomiting. Negative for constipation, diarrhea and nausea.  Genitourinary: Negative for dysuria.  Musculoskeletal: Negative.   Neurological: Negative.   Psychiatric/Behavioral: Negative.    Physical Exam   Blood pressure 121/64, pulse 93, temperature (!) 97.4 F (36.3 C), resp. rate 20, last menstrual period 03/26/2020, SpO2 100 %.  Physical Exam Constitutional:      Appearance: She is well-developed.  HENT:     Mouth/Throat:     Mouth: Mucous membranes are moist.  Abdominal:     General: Abdomen is flat.     Palpations: Abdomen is soft.     Tenderness: There is no abdominal tenderness.  Genitourinary:    Cervix: No cervical motion tenderness.  Neurological:     Mental Status: She is alert.     MAU Course   Procedures  MDM -palpated patient's abdomen during contraction, abdomen was soft, non-tender.   -cervix was closed so FFN collected but not sent -wet prep negative  -UA shows 20 of ketones, mild dehydration> Patient had not eaten a full meal today, so she was given a snack, juice and IV fluids in MAU.   1820: She denies any abdominal pain at this time.   After talking with patient, patient reports that she thinks she vomited due to the sprite and taking her iron pills (not used to having sodas),she thinks this is why she vomited and then started having contractions afterwards.   -NST 135 bpm, mod var, present acel, no decels. Patient had contractions at the beginning of visit but they have spaced out and patient no longer feels contractions.  Assessment and Plan   1. NST (non-stress test) nonreactive   2. Threatened preterm labor, third trimester   -GC CT pending -reviewed eating, drinking precautions with patient, discussed importance of snacking, high protein snacks,  -reviewed importance of hydration, increase -keep appt on June 1st at [facility] -all questions answered, patient and partner verbalized understanding of precautions.   Sherry Rios Annitta Fifield 11/01/2020, 5:07 PM

## 2020-11-02 LAB — GC/CHLAMYDIA PROBE AMP (~~LOC~~) NOT AT ARMC
Chlamydia: NEGATIVE
Comment: NEGATIVE
Comment: NORMAL
Neisseria Gonorrhea: NEGATIVE

## 2020-12-02 LAB — OB RESULTS CONSOLE GBS: GBS: NEGATIVE

## 2020-12-22 ENCOUNTER — Encounter (HOSPITAL_COMMUNITY): Payer: Self-pay | Admitting: Obstetrics and Gynecology

## 2020-12-22 ENCOUNTER — Other Ambulatory Visit: Payer: Self-pay

## 2020-12-22 ENCOUNTER — Inpatient Hospital Stay (HOSPITAL_COMMUNITY)
Admission: AD | Admit: 2020-12-22 | Discharge: 2020-12-22 | Disposition: A | Discharge status: Home / Self Care | Payer: BC Managed Care – PPO | Attending: Obstetrics and Gynecology | Admitting: Obstetrics and Gynecology

## 2020-12-22 DIAGNOSIS — O471 False labor at or after 37 completed weeks of gestation: Secondary | ICD-10-CM | POA: Diagnosis not present

## 2020-12-22 DIAGNOSIS — O479 False labor, unspecified: Secondary | ICD-10-CM

## 2020-12-22 DIAGNOSIS — Z3A38 38 weeks gestation of pregnancy: Secondary | ICD-10-CM | POA: Insufficient documentation

## 2020-12-22 NOTE — MAU Provider Note (Signed)
S: Ms. Sherry Rios is a 30 y.o. G2P0010 at [redacted]w[redacted]d  who presents to MAU today for labor evaluation.     Cervical exam by RN:  Dilation: 1 Effacement (%): 60 Cervical Position: Posterior Station: -3 Presentation: Vertex Exam by:: n druebbisch rn  Fetal Monitoring: Baseline: 130 Variability: Mod Accelerations: present Decelerations: absent Contractions: q 5 min  MDM Discussed patient with RN. NST reviewed.   A: SIUP at [redacted]w[redacted]d  False labor  P: Discharge home Labor precautions and kick counts included in AVS Patient to follow-up with OB as scheduled  Patient may return to MAU as needed or when in labor   Marylene Land, CNM 12/22/2020 2:07 PM

## 2020-12-22 NOTE — MAU Note (Signed)
Been contracting since 1030 last night.  Getting closer and stronger, now every 3-4 min.   Feeling pressure. Was closed and soft last wk.  Denies bleeding, no leaking.  Lost her mucous plug.  Reports +FM

## 2020-12-24 ENCOUNTER — Inpatient Hospital Stay (HOSPITAL_COMMUNITY)
Admission: AD | Admit: 2020-12-24 | Discharge: 2020-12-24 | Disposition: A | Discharge status: Home / Self Care | Payer: BC Managed Care – PPO | Attending: Obstetrics and Gynecology | Admitting: Obstetrics and Gynecology

## 2020-12-24 ENCOUNTER — Encounter (HOSPITAL_COMMUNITY): Payer: Self-pay | Admitting: Obstetrics and Gynecology

## 2020-12-24 ENCOUNTER — Other Ambulatory Visit: Payer: Self-pay

## 2020-12-24 DIAGNOSIS — O471 False labor at or after 37 completed weeks of gestation: Secondary | ICD-10-CM

## 2020-12-24 DIAGNOSIS — Z3A39 39 weeks gestation of pregnancy: Secondary | ICD-10-CM | POA: Diagnosis not present

## 2020-12-24 DIAGNOSIS — O479 False labor, unspecified: Secondary | ICD-10-CM

## 2020-12-24 NOTE — MAU Provider Note (Signed)
S: Patient is here for RN labor evaluation. Strip, vital signs, & chart Reviewed   O:  Vitals:   12/24/20 0529 12/24/20 0530 12/24/20 0654 12/24/20 0805  BP: 119/79  120/77 117/79  Pulse: 79  70 64  Resp: 17     Temp: 98.4 F (36.9 C)     SpO2:  100%     No results found for this or any previous visit (from the past 24 hour(s)).  Dilation: 1.5 Effacement (%): 70 Cervical Position: Posterior Station: -2 Presentation: Vertex Exam by:: weston,rn   FHR: 125 bpm, Mod Var, no Decels, 15x15 Accels UC: Q4-6 minutes   A: 1. False labor   2. [redacted] weeks gestation of pregnancy      P:  RN to discharge home in stable condition with return precautions & fetal kick counts  Judeth Horn FNP 8:08 AM

## 2020-12-25 ENCOUNTER — Inpatient Hospital Stay (HOSPITAL_COMMUNITY)
Admission: RE | Admit: 2020-12-25 | Discharge: 2020-12-25 | Disposition: A | Discharge status: Home / Self Care | Payer: BC Managed Care – PPO | Attending: Obstetrics and Gynecology | Admitting: Obstetrics and Gynecology

## 2020-12-25 ENCOUNTER — Other Ambulatory Visit: Payer: Self-pay

## 2020-12-25 ENCOUNTER — Encounter (HOSPITAL_COMMUNITY): Payer: Self-pay | Admitting: Obstetrics and Gynecology

## 2020-12-25 DIAGNOSIS — Z3A39 39 weeks gestation of pregnancy: Secondary | ICD-10-CM | POA: Insufficient documentation

## 2020-12-25 DIAGNOSIS — O471 False labor at or after 37 completed weeks of gestation: Secondary | ICD-10-CM | POA: Insufficient documentation

## 2020-12-25 DIAGNOSIS — O479 False labor, unspecified: Secondary | ICD-10-CM

## 2020-12-25 NOTE — MAU Provider Note (Addendum)
Chief Complaint:  Contractions   None    HPI: Sherry Rios is a 30 y.o. G2P0010 at [redacted]w[redacted]d who presents to maternity admissions reporting cxt started couple hours ago and feels intense. Denies contractions, leakage of fluid or vaginal bleeding. Good fetal movement.   There are no problems to display for this patient.    Active Ambulatory Problems    Diagnosis Date Noted   No Active Ambulatory Problems   Resolved Ambulatory Problems    Diagnosis Date Noted   No Resolved Ambulatory Problems   Past Medical History:  Diagnosis Date   Headache      Pregnancy Course:   Past Medical History:  Diagnosis Date   Headache    Mograines   OB History  Gravida Para Term Preterm AB Living  2       1    SAB IAB Ectopic Multiple Live Births    1          # Outcome Date GA Lbr Len/2nd Weight Sex Delivery Anes PTL Lv  2 Current           1 IAB 2009           Past Surgical History:  Procedure Laterality Date   NO PAST SURGERIES     Family History  Problem Relation Age of Onset   Hypertension Mother    Asthma Mother    Healthy Father    Social History   Tobacco Use   Smoking status: Never   Smokeless tobacco: Never  Vaping Use   Vaping Use: Never used  Substance Use Topics   Alcohol use: Not Currently    Comment: social   Drug use: No   Allergies  Allergen Reactions   Reglan [Metoclopramide]    Sulfa Antibiotics Other (See Comments)    Childhood allergy    Medications Prior to Admission  Medication Sig Dispense Refill Last Dose   acetaminophen (TYLENOL) 500 MG tablet Take 2 tablets (1,000 mg total) by mouth every 8 (eight) hours as needed for moderate pain, fever or headache. 100 tablet 2    albuterol (PROVENTIL HFA;VENTOLIN HFA) 108 (90 BASE) MCG/ACT inhaler Inhale 1-2 puffs into the lungs every 6 (six) hours as needed for wheezing or shortness of breath. 1 Inhaler 0     I have reviewed patient's Past Medical Hx, Surgical Hx, Family Hx, Social Hx, medications and  allergies.   ROS:  Review of Systems  Constitutional: Negative.   HENT: Negative.    Respiratory: Negative.    Cardiovascular: Negative.   Gastrointestinal:  Positive for abdominal pain.  Genitourinary: Negative.   Hematological: Negative.    Physical Exam  Patient Vitals for the past 24 hrs:  BP Temp Temp src Pulse Resp SpO2 Height Weight  12/25/20 1217 122/78 98.6 F (37 C) Oral (!) 102 16 -- -- --  12/25/20 0740 122/78 -- -- 79 -- -- -- --  12/25/20 0728 119/81 97.8 F (36.6 C) Oral 81 17 100 % 5\' 2"  (1.575 m) 63.5 kg   Constitutional: Well-developed, well-nourished female in no acute distress.  Cardiovascular: normal rate Respiratory: normal effort GI: Abd soft, non-tender, gravid appropriate for gestational age. Pos BS x 4 MS: Extremities nontender, no edema, normal ROM Neurologic: Alert and oriented x 4.  GU: Neg CVAT.  Pelvic: NEFG, physiologic discharge, no blood, cervix clean. Pelvic adequate for labor. No CMT  Dilation: 2.5 Effacement (%): 80 Cervical Position: Posterior Station: -2 Exam by:: J. Hawraa Stambaugh, CNM  NST: FHR baseline  125 bpm, Variability: moderate, Accelerations:present, Decelerations:  Absent= Cat 1/Reactive UC:   irregular, every 3-8 minutes SVE:   Dilation: 2.5 Effacement (%): 80 Station: -2 Exam by:: J. Katerin Negrete, CNM, vertex verified by fetal sutures.  Leopold's: Position vertex  Labs: No results found for this or any previous visit (from the past 24 hour(s)).  Imaging:  No results found.  MAU Course: Orders Placed This Encounter  Procedures   Fetal Kick Count:  Lie on our left side for one hour after a meal, and count the number of times your baby kicks.  If it is less than 5 times, get up, move around and drink some juice.  Repeat the test 30 minutes later.  If it is still less than 5 kicks in an hour, notify your doctor.   LABOR:  When conractions begin, you should start to time them from the beginning of one contraction to the beginning   of the next.  When contractions are 5 - 10 minutes apart or less and have been regular for at least an hour, you should call your health care provider.   Notify physician for bleeding from the vagina   Notify physician for pain or burning when urinating   Notify physician for chills or fever   Notify physician for increase in vaginal discharge   Notify physician for pelvic pressure (sudden increase)   Notify physician if baby moving less than usual   Notify physician for sudden, constant, or occasional abdominal pain   Notify physician for sudden gushing of fluid from the vagina (with or without continued leaking)   Notify physician for leaking of fluid   Notify physician for fainting spells, "black outs" or loss of consciousness   Notify physician for severe or continued nausea or vomiting   Notify physician for blurring of vision or spots before the eyes   Discharge patient Discharge disposition: 01-Home or Self Care; Discharge patient date: 12/25/2020   No orders of the defined types were placed in this encounter.   MDM:  Assessment: 1. False labor   Cervical change 1 cm over last three hours, false labor, no cervical change over last 2 hours, intact, stable with +FM, NST reactive. Discharged in stable condition.   Plan: Discharge home in stable condition.  Labor precautions and fetal kick counts Tylenol 100mg  PO Q8H and benadryl 50mg  PO Q8H PRN for pain and sleep, baths, meditation.   Follow-up Information     [facility] Follow up.   Specialty: Obstetrics and Gynecology Why: 1 week ROB Contact information: [address] [facility] [id number] [place] [phone]                Allergies as of 12/25/2020       Reactions   Reglan [metoclopramide]    Sulfa Antibiotics Other (See Comments)   Childhood allergy         Medication List     TAKE these medications    acetaminophen 500 MG tablet Commonly known  as: TYLENOL Take 2 tablets (1,000 mg total) by mouth every 8 (eight) hours as needed for moderate pain, fever or headache.   albuterol 108 (90 Base) MCG/ACT inhaler Commonly known as: VENTOLIN HFA Inhale 1-2 puffs into the lungs every 6 (six) hours as needed for wheezing or shortness of breath.        [facility] NP-C, CNM [place], [place], [place] 12/25/2020 12:21 PM

## 2020-12-25 NOTE — MAU Note (Signed)
Pt reports ctx q 3 min for the past 3 hours but states she has been contracting since midnight.  Pt denies LOF but reports some mucous like dc after having membranes swept in office yesterday.  FHR 130 in triage. +FM

## 2020-12-30 ENCOUNTER — Other Ambulatory Visit: Payer: Self-pay

## 2020-12-30 ENCOUNTER — Inpatient Hospital Stay (HOSPITAL_COMMUNITY)
Admission: AD | Admit: 2020-12-30 | Discharge: 2021-01-01 | DRG: 806 | Disposition: A | Discharge status: Home / Self Care | Payer: BC Managed Care – PPO | Attending: Obstetrics and Gynecology | Admitting: Obstetrics and Gynecology

## 2020-12-30 ENCOUNTER — Inpatient Hospital Stay (HOSPITAL_COMMUNITY): Payer: BC Managed Care – PPO | Admitting: Anesthesiology

## 2020-12-30 ENCOUNTER — Encounter (HOSPITAL_COMMUNITY): Payer: Self-pay | Admitting: Obstetrics and Gynecology

## 2020-12-30 DIAGNOSIS — A6000 Herpesviral infection of urogenital system, unspecified: Secondary | ICD-10-CM | POA: Diagnosis present

## 2020-12-30 DIAGNOSIS — B009 Herpesviral infection, unspecified: Secondary | ICD-10-CM | POA: Diagnosis present

## 2020-12-30 DIAGNOSIS — O9832 Other infections with a predominantly sexual mode of transmission complicating childbirth: Secondary | ICD-10-CM | POA: Diagnosis present

## 2020-12-30 DIAGNOSIS — O9081 Anemia of the puerperium: Secondary | ICD-10-CM | POA: Diagnosis not present

## 2020-12-30 DIAGNOSIS — D62 Acute posthemorrhagic anemia: Secondary | ICD-10-CM | POA: Diagnosis not present

## 2020-12-30 DIAGNOSIS — Z3A39 39 weeks gestation of pregnancy: Secondary | ICD-10-CM | POA: Diagnosis not present

## 2020-12-30 DIAGNOSIS — O26893 Other specified pregnancy related conditions, third trimester: Secondary | ICD-10-CM | POA: Diagnosis present

## 2020-12-30 DIAGNOSIS — Z20822 Contact with and (suspected) exposure to covid-19: Secondary | ICD-10-CM | POA: Diagnosis present

## 2020-12-30 LAB — RESP PANEL BY RT-PCR (FLU A&B, COVID) ARPGX2
Influenza A by PCR: NEGATIVE
Influenza B by PCR: NEGATIVE
SARS Coronavirus 2 by RT PCR: NEGATIVE

## 2020-12-30 LAB — CBC
HCT: 27.2 % — ABNORMAL LOW (ref 36.0–46.0)
Hemoglobin: 9.4 g/dL — ABNORMAL LOW (ref 12.0–15.0)
MCH: 27.2 pg (ref 26.0–34.0)
MCHC: 34.6 g/dL (ref 30.0–36.0)
MCV: 78.6 fL — ABNORMAL LOW (ref 80.0–100.0)
Platelets: 225 10*3/uL (ref 150–400)
RBC: 3.46 MIL/uL — ABNORMAL LOW (ref 3.87–5.11)
RDW: 15.5 % (ref 11.5–15.5)
WBC: 11.1 10*3/uL — ABNORMAL HIGH (ref 4.0–10.5)
nRBC: 0 % (ref 0.0–0.2)

## 2020-12-30 LAB — TYPE AND SCREEN
ABO/RH(D): A POS
Antibody Screen: NEGATIVE

## 2020-12-30 LAB — RPR: RPR Ser Ql: NONREACTIVE

## 2020-12-30 MED ORDER — PHENYLEPHRINE 40 MCG/ML (10ML) SYRINGE FOR IV PUSH (FOR BLOOD PRESSURE SUPPORT): 80.0000 ug | PREFILLED_SYRINGE | INTRAVENOUS | Status: DC | PRN

## 2020-12-30 MED ORDER — EPHEDRINE 5 MG/ML INJ: 10.0000 mg | INTRAVENOUS | Status: DC | PRN

## 2020-12-30 MED ORDER — LACTATED RINGERS IV SOLN: INTRAVENOUS | Status: DC

## 2020-12-30 MED ORDER — ONDANSETRON HCL 4 MG/2ML IJ SOLN
4.0000 mg | Freq: Four times a day (QID) | INTRAMUSCULAR | Status: DC | PRN
  Administered 2020-12-30: 4 mg via INTRAVENOUS
  Filled 2020-12-30: qty 2

## 2020-12-30 MED ORDER — DIBUCAINE (PERIANAL) 1 % EX OINT: 1.0000 "application " | TOPICAL_OINTMENT | CUTANEOUS | Status: DC | PRN

## 2020-12-30 MED ORDER — POLYSACCHARIDE IRON COMPLEX 150 MG PO CAPS
150.0000 mg | ORAL_CAPSULE | Freq: Every day | ORAL | Status: DC
  Administered 2020-12-30 – 2021-01-01 (×3): 150 mg via ORAL
  Filled 2020-12-30 (×3): qty 1

## 2020-12-30 MED ORDER — ONDANSETRON HCL 4 MG/2ML IJ SOLN: 4.0000 mg | INTRAMUSCULAR | Status: DC | PRN

## 2020-12-30 MED ORDER — OXYTOCIN-SODIUM CHLORIDE 30-0.9 UT/500ML-% IV SOLN
2.5000 [IU]/h | INTRAVENOUS | Status: DC
  Filled 2020-12-30: qty 500

## 2020-12-30 MED ORDER — OXYCODONE-ACETAMINOPHEN 5-325 MG PO TABS
2.0000 | ORAL_TABLET | ORAL | Status: DC | PRN
Start: 2020-12-30 — End: 2020-12-30

## 2020-12-30 MED ORDER — DIPHENHYDRAMINE HCL 50 MG/ML IJ SOLN: 12.5000 mg | INTRAMUSCULAR | Status: DC | PRN

## 2020-12-30 MED ORDER — COCONUT OIL OIL
1.0000 "application " | TOPICAL_OIL | Status: DC | PRN
  Administered 2020-12-30: 1 via TOPICAL

## 2020-12-30 MED ORDER — SODIUM CHLORIDE 0.9 % IV SOLN
8.0000 mg | Freq: Once | INTRAVENOUS | Status: DC
  Filled 2020-12-30: qty 4

## 2020-12-30 MED ORDER — TETANUS-DIPHTH-ACELL PERTUSSIS 5-2.5-18.5 LF-MCG/0.5 IM SUSY: 0.5000 mL | PREFILLED_SYRINGE | Freq: Once | INTRAMUSCULAR | Status: DC

## 2020-12-30 MED ORDER — ACETAMINOPHEN 325 MG PO TABS
650.0000 mg | ORAL_TABLET | ORAL | Status: DC | PRN
  Administered 2020-12-30 – 2021-01-01 (×3): 650 mg via ORAL
  Filled 2020-12-30 (×3): qty 2

## 2020-12-30 MED ORDER — OXYCODONE-ACETAMINOPHEN 5-325 MG PO TABS
1.0000 | ORAL_TABLET | ORAL | Status: DC | PRN
Start: 2020-12-30 — End: 2020-12-30

## 2020-12-30 MED ORDER — OXYTOCIN BOLUS FROM INFUSION
333.0000 mL | Freq: Once | INTRAVENOUS | Status: AC
  Administered 2020-12-30: 333 mL via INTRAVENOUS

## 2020-12-30 MED ORDER — BENZOCAINE-MENTHOL 20-0.5 % EX AERO
1.0000 "application " | INHALATION_SPRAY | CUTANEOUS | Status: DC | PRN
  Administered 2020-12-30: 1 via TOPICAL
  Filled 2020-12-30: qty 56

## 2020-12-30 MED ORDER — ACETAMINOPHEN 325 MG PO TABS: 650.0000 mg | ORAL_TABLET | ORAL | Status: DC | PRN

## 2020-12-30 MED ORDER — LIDOCAINE HCL (PF) 1 % IJ SOLN: 30.0000 mL | INTRAMUSCULAR | Status: DC | PRN

## 2020-12-30 MED ORDER — FENTANYL-BUPIVACAINE-NACL 0.5-0.125-0.9 MG/250ML-% EP SOLN
12.0000 mL/h | EPIDURAL | Status: DC | PRN
  Administered 2020-12-30: 12 mL/h via EPIDURAL
  Filled 2020-12-30: qty 250

## 2020-12-30 MED ORDER — SENNOSIDES-DOCUSATE SODIUM 8.6-50 MG PO TABS
2.0000 | ORAL_TABLET | Freq: Every day | ORAL | Status: DC
  Administered 2020-12-31 – 2021-01-01 (×2): 2 via ORAL
  Filled 2020-12-30 (×2): qty 2

## 2020-12-30 MED ORDER — ZOLPIDEM TARTRATE 5 MG PO TABS: 5.0000 mg | ORAL_TABLET | Freq: Every evening | ORAL | Status: DC | PRN

## 2020-12-30 MED ORDER — SIMETHICONE 80 MG PO CHEW: 80.0000 mg | CHEWABLE_TABLET | ORAL | Status: DC | PRN

## 2020-12-30 MED ORDER — FLEET ENEMA 7-19 GM/118ML RE ENEM: 1.0000 | ENEMA | RECTAL | Status: DC | PRN

## 2020-12-30 MED ORDER — PRENATAL MULTIVITAMIN CH
1.0000 | ORAL_TABLET | Freq: Every day | ORAL | Status: DC
  Administered 2020-12-30 – 2021-01-01 (×3): 1 via ORAL
  Filled 2020-12-30 (×3): qty 1

## 2020-12-30 MED ORDER — WITCH HAZEL-GLYCERIN EX PADS: 1.0000 "application " | MEDICATED_PAD | CUTANEOUS | Status: DC | PRN

## 2020-12-30 MED ORDER — IBUPROFEN 600 MG PO TABS
600.0000 mg | ORAL_TABLET | Freq: Four times a day (QID) | ORAL | Status: DC
  Administered 2020-12-30 – 2021-01-01 (×8): 600 mg via ORAL
  Filled 2020-12-30 (×9): qty 1

## 2020-12-30 MED ORDER — SOD CITRATE-CITRIC ACID 500-334 MG/5ML PO SOLN: 30.0000 mL | ORAL | Status: DC | PRN

## 2020-12-30 MED ORDER — LIDOCAINE HCL (PF) 1 % IJ SOLN
INTRAMUSCULAR | Status: DC | PRN
  Administered 2020-12-30 (×2): 5 mL via EPIDURAL

## 2020-12-30 MED ORDER — DIPHENHYDRAMINE HCL 25 MG PO CAPS: 25.0000 mg | ORAL_CAPSULE | Freq: Four times a day (QID) | ORAL | Status: DC | PRN

## 2020-12-30 MED ORDER — LACTATED RINGERS IV SOLN: 500.0000 mL | INTRAVENOUS | Status: DC | PRN

## 2020-12-30 MED ORDER — ONDANSETRON HCL 4 MG PO TABS: 4.0000 mg | ORAL_TABLET | ORAL | Status: DC | PRN

## 2020-12-30 MED ORDER — FENTANYL CITRATE (PF) 100 MCG/2ML IJ SOLN
100.0000 ug | Freq: Once | INTRAMUSCULAR | Status: AC
  Administered 2020-12-30: 100 ug via INTRAVENOUS
  Filled 2020-12-30: qty 2

## 2020-12-30 MED ORDER — LACTATED RINGERS IV SOLN: 500.0000 mL | Freq: Once | INTRAVENOUS | Status: DC

## 2020-12-30 MED ORDER — FENTANYL CITRATE (PF) 100 MCG/2ML IJ SOLN
100.0000 ug | INTRAMUSCULAR | Status: DC | PRN
  Administered 2020-12-30: 100 ug via INTRAVENOUS
  Filled 2020-12-30: qty 2

## 2020-12-30 NOTE — Anesthesia Postprocedure Evaluation (Signed)
Anesthesia Post Note  Patient: Comfort Iversen  Procedure(s) Performed: AN AD HOC LABOR EPIDURAL     Patient location during evaluation: Mother Baby Anesthesia Type: Epidural Level of consciousness: awake Pain management: satisfactory to patient Vital Signs Assessment: post-procedure vital signs reviewed and stable Respiratory status: spontaneous breathing Cardiovascular status: stable Anesthetic complications: no   No notable events documented.  Last Vitals:  Vitals:   12/30/20 1200 12/30/20 1620  BP: 119/78 121/74  Pulse: 89 81  Resp: 18 16  Temp: 36.7 C 36.8 C  SpO2: 98% 99%    Last Pain:  Vitals:   12/30/20 1620  TempSrc: Oral  PainSc: 2    Pain Goal: Patients Stated Pain Goal: 2 (12/30/20 0708)       Report via Heide Scales RN          Cephus Shelling

## 2020-12-30 NOTE — H&P (Signed)
OB ADMISSION/ HISTORY & PHYSICAL:  Admission Date: 12/30/2020  1:15 AM  Admit Diagnosis: Spontaneous labor  Sherry Rios is a 30 y.o. female G2P0010 [redacted]w[redacted]d presenting for ctx q 3 min. Endorses active FM, denies vaginal bleeding. Ctx and LOF began at 0100.  History of current pregnancy: G2P0010   Primary OB Provider: [facility] Patient entered care with [facility] at 8.2 wks.   EDC 12/31/20 by LMP10/08/21 and congruent w/ 8.2 wk U/S.   Anatomy scan:  19.5 wks, complete w/ posterior placenta.   Last evaluation: 39  wks  Significant prenatal events:  Patient Active Problem List   Diagnosis Date Noted   Normal labor and delivery 12/30/2020    Prenatal Labs: ABO, Rh: --/--/A POS (07/14 0229) Antibody: NEG (07/14 0229) Rubella:   Immune RPR:   NR HBsAg:   NR HIV:   NR GTT: 80 GBS:   Negative 12/02/20 GC/CHL: Negative/Negative Genetics: Low risk Tdap/influenza vaccines: Declined   OB History  Gravida Para Term Preterm AB Living  2       1    SAB IAB Ectopic Multiple Live Births    1          # Outcome Date GA Lbr Len/2nd Weight Sex Delivery Anes PTL Lv  2 Current           1 IAB 2009            Medical / Surgical History: Past medical history:  Past Medical History:  Diagnosis Date   Headache    Mograines    Past surgical history:  Past Surgical History:  Procedure Laterality Date   NO PAST SURGERIES     Family History:  Family History  Problem Relation Age of Onset   Hypertension Mother    Asthma Mother    Healthy Father     Social History:  reports that she has never smoked. She has never used smokeless tobacco. She reports previous alcohol use. She reports that she does not use drugs.  Allergies: Reglan [metoclopramide] and Sulfa antibiotics   Current Medications at time of admission:  Prior to Admission medications   Medication Sig Start Date End Date Taking? Authorizing Provider  acetaminophen (TYLENOL) 500 MG tablet Take 2 tablets (1,000 mg total) by mouth  every 8 (eight) hours as needed for moderate pain, fever or headache. 06/23/20 06/23/21 Yes Marsala, Arlana Pouch, MD  Prenatal Vit-Fe Fumarate-FA (PRENATAL MULTIVITAMIN) TABS tablet Take 1 tablet by mouth daily at 12 noon.   Yes [provider]  albuterol (PROVENTIL HFA;VENTOLIN HFA) 108 (90 BASE) MCG/ACT inhaler Inhale 1-2 puffs into the lungs every 6 (six) hours as needed for wheezing or shortness of breath. 06/19/14   Arthor Captain, PA-C    Review of Systems: Constitutional: Negative   HENT: Negative   Eyes: Negative   Respiratory: Negative   Cardiovascular: Negative   Gastrointestinal: Negative  Genitourinary: negative for bloody show, Positive for LOF   Musculoskeletal: Negative   Skin: Negative   Neurological: Negative   Endo/Heme/Allergies: Negative   Psychiatric/Behavioral: Negative    Physical Exam: VS: Blood pressure 113/72, pulse 70, temperature 98 F (36.7 C), temperature source Oral, resp. rate 18, last menstrual period 03/26/2020, SpO2 98 %. AAO x3, no signs of distress Cardiovascular: RRR Respiratory: Lung fields clear to ausculation GU/GI: Abdomen gravid, non-tender, non-distended, active FM, vertex Extremities: negative edema, negative for pain, tenderness, and cords  Cervical exam:Dilation: 5.5 Effacement (%): 90 Station: -1 Exam by:: AClearance Coots., RNC-OB FHR: baseline rate  125 / variability moderate / accelerations present / absent decelerations TOCO: 1-3   Prenatal Transfer Tool  Maternal Diabetes: No Genetic Screening: Normal Maternal Ultrasounds/Referrals: Normal Fetal Ultrasounds or other Referrals:  None Maternal Substance Abuse:  No Significant Maternal Medications:  None Significant Maternal Lab Results: Group B Strep negative    Assessment: 30 y.o. G2P0010 [redacted]w[redacted]d admitted for spontaneous latent labor. SROM @ 0100, moderate mec.  latent stage of labor FHR category 1 GBS Negative Pain management plan: epidural prn   Plan:  Admit to  L&D Routine admission orders Epidural PRN  Will notify Dr Su Hilt and Philemon Kingdom CNM  of admission and plan of care.  Carollee Leitz MSN, CNM 12/30/2020 6:33 AM

## 2020-12-30 NOTE — Plan of Care (Signed)
Pt comfortable with epidural

## 2020-12-30 NOTE — Anesthesia Preprocedure Evaluation (Signed)
Anesthesia Evaluation  Patient identified by MRN, date of birth, ID band Patient awake    Reviewed: Allergy & Precautions, H&P , NPO status , Patient's Chart, lab work & pertinent test results  History of Anesthesia Complications Negative for: history of anesthetic complications  Airway Mallampati: II  TM Distance: >3 FB Neck ROM: full    Dental no notable dental hx. (+) Teeth Intact   Pulmonary neg pulmonary ROS,    Pulmonary exam normal breath sounds clear to auscultation       Cardiovascular negative cardio ROS Normal cardiovascular exam Rhythm:regular Rate:Normal     Neuro/Psych  Headaches, negative psych ROS   GI/Hepatic negative GI ROS, Neg liver ROS,   Endo/Other  negative endocrine ROS  Renal/GU negative Renal ROS  negative genitourinary   Musculoskeletal   Abdominal   Peds  Hematology  (+) Blood dyscrasia, anemia ,   Anesthesia Other Findings   Reproductive/Obstetrics (+) Pregnancy                             Anesthesia Physical Anesthesia Plan  ASA: 2  Anesthesia Plan: Epidural   Post-op Pain Management:    Induction:   PONV Risk Score and Plan:   Airway Management Planned:   Additional Equipment:   Intra-op Plan:   Post-operative Plan:   Informed Consent: I have reviewed the patients History and Physical, chart, labs and discussed the procedure including the risks, benefits and alternatives for the proposed anesthesia with the patient or authorized representative who has indicated his/her understanding and acceptance.       Plan Discussed with:   Anesthesia Plan Comments:         Anesthesia Quick Evaluation

## 2020-12-30 NOTE — Lactation Note (Signed)
This note was copied from a baby's chart. Lactation Consultation Note  Patient Name: Sherry Rios [id number] Date: 12/30/2020 Reason for consult: Initial assessment;1st time breastfeeding;Term Age:28 hours Per mom, infant did not latch in L&D, was given few drops of colostrum by spoon. LC observed infant to be jittery, per mom infant had low body temp's so she has been  doing STS with infant  as advised by RN. Mom made attempt to latch infant using the football  and cross cradle hold  position on her left breast, infant did not sustain latch, after 1 to 2 suckles immediately came off the breast. LC observed infant sucks his top lip inward, LC did suck training on gloved finger and when giving EBM by curve tip syringe, LC  flanged infant's top and bottom lip outward.  Mom was shown how to use hand pump and immediately expressed 19 mls of colostrum. Infant was given 5  mls at first by spoon, LC worked on suck training infant took last 7 mls by curve tip syringe, infant took total of 12 mls of mom's EBM with this feeding. LC explained that it may take time for infant to latch, BF is learned behavior and to continue make attempts to latch infant at the breast, asking  for assistance with latching infant from RN and [facility] services.  LC discussed infant's input and output with parents. Mom made aware of O/P services, breastfeeding support groups, community resources, and our phone # for post-discharge questions.   Mom's plan: 1- Mom will continue to BF infant according to feeing cues, 8 to 12 or more times within 24 hours, BF infant STS. 2- Mom will ask for latch assistance from RN or LC if needed. 3- Mom will offer infant her EBM that she pumped after latching infant at the breast for the  next feeding and continue to do  lots of STS with infant. 4- Mom will continue to give EBM that she pump with the  hand pump,  if infant doesn't latch at the breast.  Maternal Data Has patient been taught Hand  Expression?: Yes Does the patient have breastfeeding experience prior to this delivery?: No  Feeding Mother's Current Feeding Choice: Breast Milk  LATCH Score Latch: Too sleepy or reluctant, no latch achieved, no sucking elicited.  Audible Swallowing: None  Type of Nipple: Everted at rest and after stimulation  Comfort (Breast/Nipple): Soft / non-tender  Hold (Positioning): Assistance needed to correctly position infant at breast and maintain latch.  LATCH Score: 5   Lactation Tools Discussed/Used Tools: Pump Breast pump type: Manual Pump Education: Setup, frequency, and cleaning;Milk Storage Reason for Pumping: Mom used hand pump her perference, infant currently not sustaining latch at the breast. Pumping frequency: Mom will latch infant every feeding and if doesn't latch mom will give her EBM that she pumped. Pumped volume: 19 mL  Interventions Interventions: Breast feeding basics reviewed;Assisted with latch;Skin to skin;Hand express;Breast compression;Adjust position;Support pillows;Position options;Expressed milk;Hand pump;Education  Discharge Pump: Manual;Personal WIC Program: Yes  Consult Status Consult Status: Follow-up Date: 12/31/20    Danelle Earthly 12/30/2020, 5:45 PM

## 2020-12-30 NOTE — Anesthesia Procedure Notes (Signed)
Epidural Patient location during procedure: OB Start time: 12/30/2020 4:46 AM End time: 12/30/2020 4:56 AM  Staffing Anesthesiologist: Leonides Grills, MD Performed: anesthesiologist   Preanesthetic Checklist Completed: patient identified, IV checked, site marked, risks and benefits discussed, monitors and equipment checked, pre-op evaluation and timeout performed  Epidural Patient position: sitting Prep: DuraPrep Patient monitoring: heart rate, cardiac monitor, continuous pulse ox and blood pressure Approach: midline Location: L4-L5 Injection technique: LOR air  Needle:  Needle type: Tuohy  Needle gauge: 17 G Needle length: 9 cm Needle insertion depth: 4 cm Catheter type: closed end flexible Catheter size: 19 Gauge Catheter at skin depth: 9 cm Test dose: negative and Other  Assessment Events: blood not aspirated, injection not painful, no injection resistance and negative IV test  Additional Notes Informed consent obtained prior to proceeding including risk of failure, 1% risk of PDPH, risk of minor discomfort and bruising. Discussed alternatives to epidural analgesia and patient desires to proceed.  Timeout performed pre-procedure verifying patient name, procedure, and platelet count.  Patient tolerated procedure well. Reason for block:procedure for pain

## 2020-12-30 NOTE — Lactation Note (Signed)
This note was copied from a baby's chart. Lactation Consultation Note  Patient Name: Sherry Rios [id number] Date: 12/30/2020 Reason for consult: L&D Initial assessment;1st time breastfeeding;Primapara;Term Age: LC/ 1st visit in LD at 40 mins PP ( LC was waiting to see mom at 30 mins / repair was taking place ).  Baby wide awake,STS moms chest , LC offered to assist/ mom receptive/latch was on and off / LC reviewed hand expressing / excellent flow / spoon fed baby 1 ml / tol well . attempted to latch again 5-6 sucks/ few swallows. Mom aware see will be seen by [facility] again today on MBU.  Latch of 6-7  Maternal Data Has patient been taught Hand Expression?: Yes Does the patient have breastfeeding experience prior to this delivery?: No  Feeding Mother's Current Feeding Choice: Breast Milk  LATCH Score Latch: Repeated attempts needed to sustain latch, nipple held in mouth throughout feeding, stimulation needed to elicit sucking reflex.  Audible Swallowing: A few with stimulation  Type of Nipple: Everted at rest and after stimulation  Comfort (Breast/Nipple): Soft / non-tender  Hold (Positioning): Assistance needed to correctly position infant at breast and maintain latch.  LATCH Score: 7   Lactation Tools Discussed/Used    Interventions Interventions: Breast feeding basics reviewed;Assisted with latch;Skin to skin;Breast massage;Hand express;Adjust position;Expressed milk;Education  Discharge    Consult Status Consult Status: Follow-up (from LD) Date: 12/30/20 Follow-up type: In-patient    Sherry Rios 12/30/2020, 10:00 AM

## 2020-12-30 NOTE — MAU Note (Signed)
Reports contractions since 10pm and are now every 3 minutes. Denies vaginal bleeding. Reports leaking of fluid since 1am. +FM.

## 2020-12-31 LAB — CBC
HCT: 22.5 % — ABNORMAL LOW (ref 36.0–46.0)
Hemoglobin: 7.5 g/dL — ABNORMAL LOW (ref 12.0–15.0)
MCH: 26.6 pg (ref 26.0–34.0)
MCHC: 33.3 g/dL (ref 30.0–36.0)
MCV: 79.8 fL — ABNORMAL LOW (ref 80.0–100.0)
Platelets: 217 10*3/uL (ref 150–400)
RBC: 2.82 MIL/uL — ABNORMAL LOW (ref 3.87–5.11)
RDW: 15.5 % (ref 11.5–15.5)
WBC: 13.9 10*3/uL — ABNORMAL HIGH (ref 4.0–10.5)
nRBC: 0 % (ref 0.0–0.2)

## 2020-12-31 MED ORDER — COVID-19 MRNA VACC (MODERNA) 100 MCG/0.5ML IM SUSP
0.5000 mL | Freq: Once | INTRAMUSCULAR | Status: AC
  Administered 2020-12-31: 0.5 mL via INTRAMUSCULAR
  Filled 2020-12-31: qty 0.5

## 2020-12-31 MED ORDER — SODIUM CHLORIDE 0.9 % IV SOLN
500.0000 mg | Freq: Once | INTRAVENOUS | Status: AC
  Administered 2020-12-31: 500 mg via INTRAVENOUS
  Filled 2020-12-31: qty 25

## 2020-12-31 NOTE — Progress Notes (Signed)
Postpartum day #1, NSVD  Subjective Pt without complaints.  Lochia normal.  Pain controlled.  Breast feeding yes.  Baby latching but not staying on.  Pt reports she is getting help. Denies SOB, dizziness, fatigue.  Temp:  [98.2 F (36.8 C)-98.8 F (37.1 C)] 98.8 F (37.1 C) (07/15 0510) Pulse Rate:  [78-96] 78 (07/15 0510) Resp:  [16-17] 16 (07/15 0510) BP: (112-131)/(69-74) 112/74 (07/15 0510) SpO2:  [97 %-99 %] 98 % (07/15 0510)  Gen:  NAD, A&O x 3 Uterine fundus:  Firm, nontender Lochia normal Ext:  minimal Edema, no calf tenderness bilaterally    CBC Latest Ref Rng & Units 12/31/2020 12/30/2020 06/23/2020  WBC 4.0 - 10.5 K/uL 13.9(H) 11.1(H) 5.5  Hemoglobin 12.0 - 15.0 g/dL 7.5(L) 9.4(L) 10.4(L)  Hematocrit 36.0 - 46.0 % 22.5(L) 27.2(L) 30.5(L)  Platelets 150 - 400 K/uL 217 225 220     A/P: S/p SVD doing well. Desires circumcision.  Will do today. Anemia.  Asymptomatic.    Continue po iron.  S/Sxs of anemia discussed. Routine postpartum care. Lactation support. Discharge in am.  Geryl Rankins 12/31/2020, 12:34 PM

## 2021-01-01 ENCOUNTER — Encounter (HOSPITAL_COMMUNITY): Payer: Self-pay | Admitting: Obstetrics and Gynecology

## 2021-01-01 MED ORDER — POLYSACCHARIDE IRON COMPLEX 150 MG PO CAPS: 150.0000 mg | ORAL_CAPSULE | Freq: Every day | ORAL | 1 refills | Status: AC

## 2021-01-01 MED ORDER — IBUPROFEN 600 MG PO TABS
600.0000 mg | ORAL_TABLET | Freq: Four times a day (QID) | ORAL | 0 refills | Status: AC
Start: 2021-01-01 — End: ?

## 2021-01-01 NOTE — Discharge Summary (Signed)
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 yo G2P1011 at 359w6das admitted in Latent Labor on 12/30/2020. Patient had an uncomplicated labor course as follows:  Membrane Rupture Time/Date: 1:00 AM ,12/30/2020   Delivery Method:Vaginal, Vacuum (Extractor)  Episiotomy: None  Lacerations:  2nd degree;Perineal;Sulcus  Patient had an uncomplicated postpartum course.  She is ambulating, tolerating a regular diet, passing flatus, and urinating well. Patient is discharged home in stable condition on 01/01/21.  Newborn Data: Birth date:12/30/2020  Birth time:8:50 AM  Gender:Female  Living status:Living  Apgars: ,  Weight:2815 g   Magnesium Sulfate received: No BMZ received: No Rhophylac:N/A MMR:N/A T-DaP:Given prenatally Flu: N/A Transfusion:No  Physical exam  Vitals:   12/31/20 0030 12/31/20 0510 12/31/20 1532 01/01/21 0500  BP: 131/70 112/74 107/81 103/64  Pulse: 96 78 87 78  Resp: 17 16 17 17   Temp: 98.3 F (36.8 C) 98.8 F (37.1 C) 98.6 F (37 C) 98 F (36.7 C)  TempSrc: Oral Oral Oral Oral  SpO2: 97% 98%     General: alert, cooperative, and no distress Lochia: appropriate Uterine Fundus: firm Incision: N/A DVT Evaluation: No evidence of  DVT seen on physical exam. Labs: Lab Results  Component Value Date   WBC 13.9 (H) 12/31/2020   HGB 7.5 (L) 12/31/2020   HCT 22.5 (L) 12/31/2020   MCV 79.8 (L) 12/31/2020   PLT 217 12/31/2020   CMP Latest Ref Rng & Units 06/23/2020  Glucose 70 - 99 mg/dL 81  BUN 6 - 20 mg/dL 6  Creatinine 0.44 - 1.00 mg/dL 0.70  Sodium 135 - 145 mmol/L 133(L)  Potassium 3.5 - 5.1 mmol/L 4.0  Chloride 98 - 111 mmol/L 102  CO2 22 - 32 mmol/L 21(L)  Calcium 8.9 - 10.3 mg/dL 9.6  Total Protein 6.5 - 8.1 g/dL 6.8  Total Bilirubin 0.3 - 1.2 mg/dL 0.5  Alkaline Phos 38 - 126 U/L 33(L)  AST 15 - 41 U/L 21  ALT 0 - 44 U/L 14   Edinburgh Score: Edinburgh Postnatal Depression Scale Screening Tool 12/31/2020  I have been able to laugh and see the funny side of things. 0  I have looked forward with enjoyment to things. 0  I have blamed myself unnecessarily when things went wrong. 0  I have been anxious or worried for no good reason. 2  I have felt scared or panicky for no good reason. 0  Things have been getting on top of me. 0  I have been so unhappy that I have had difficulty sleeping. 0  I  have felt sad or miserable. 1  I have been so unhappy that I have been crying. 1  The thought of harming myself has occurred to me. 0  Edinburgh Postnatal Depression Scale Total 4      After visit meds:  Allergies as of 01/01/2021       Reactions   Reglan [metoclopramide]    Tardive Dyskinesia   Sulfa Antibiotics Other (See Comments)   Childhood allergy         Medication List     STOP taking these medications    acetaminophen 500 MG tablet Commonly known as: TYLENOL   albuterol 108 (90 Base) MCG/ACT inhaler Commonly known as: VENTOLIN HFA   prenatal multivitamin Tabs tablet       TAKE these medications    ibuprofen 600 MG tablet Commonly known as: ADVIL Take 1 tablet (600 mg total) by mouth every 6 (six) hours.   iron polysaccharides 150 MG capsule Commonly known as: NIFEREX Take 1  capsule (150 mg total) by mouth daily.         Discharge home in stable condition Infant Feeding: Bottle and Breast Infant Disposition:home with mother Discharge instruction: per After Visit Summary and Postpartum booklet. Activity: Advance as tolerated. Pelvic rest for 6 weeks.  Diet: routine diet Anticipated Birth Control: Condoms Postpartum Appointment:6 weeks Additional Postpartum F/U:  na Future Appointments:No future appointments. Follow up Visit:  Follow-up Information     [facility], [place] Follow up in 6 week(s).   Specialty: Obstetrics and Gynecology Contact information: [address] [place] [address] [phone]                     01/01/2021 Ike Bene, CNM

## 2021-01-01 NOTE — Lactation Note (Signed)
This note was copied from a baby's chart. Lactation Consultation Note  Patient Name: Sherry Rios PHXTA'V Date: 01/01/2021 Reason for consult: Follow-up assessment;Primapara;1st time breastfeeding;Term Age:28 hours    Follow up visit to 49 hours old baby. Per mother, baby has been having good voids and stools. Mother states baby has not been successful at latching and she has mostly formula-fed. Mother reports an episode of emesis this am after formula feeding. LC encouraged and demonstrated paced bottle feeding, frequent burping and upright position. Mother explains she may try breastfeeding once at home. Talked about positions that mother can try and reinforced aiming for a deep asymmetrical latch.  Mother is aware of local support resources and [facility] services warm-line.   Plan:   1-Feeding on demand 2-Follow volume guidelines according to age 20-Keep infant awake during feeding session: massaging breast, infant's hand/shoulder/feet 4-Follow paced bottlefeeding teaching 5-Hand express or use DEBP for supplementation purposes. 6-Monitor voids and stools as signs good intake.  7-Contact LC as needed for feeds/support/concerns/questions    All questions answered at this time. Family is waiting to be discharged home today.   Feeding Mother's Current Feeding Choice: Breast Milk and Formula  Interventions Interventions: Breast feeding basics reviewed;Skin to skin;Expressed milk;Education  Discharge Discharge Education: Engorgement and breast care;Warning signs for feeding baby Pump: Manual;Personal  Consult Status Consult Status: Complete Date: 01/01/21 Follow-up type: Call as needed    Sherry Rios 01/01/2021, 10:38 AM

## 2021-01-12 ENCOUNTER — Telehealth (HOSPITAL_COMMUNITY): Payer: Self-pay | Admitting: *Deleted

## 2021-01-12 NOTE — Telephone Encounter (Signed)
No voicemail. Unable to contact mom for hospital discharge follow-up call.  Duffy Rhody, RN 01/12/2021 at 2:43pm
